# Patient Record
Sex: Male | Born: 1965 | Race: Black or African American | Hispanic: No | Marital: Single | Smoking: Former smoker
Health system: Southern US, Community
[De-identification: ages and names within clinical notes are randomized; demographics above are authoritative.]

---

## 2005-03-30 ENCOUNTER — Ambulatory Visit: Payer: Self-pay | Admitting: Family Medicine

## 2019-07-03 ENCOUNTER — Emergency Department: Payer: Self-pay

## 2019-07-03 ENCOUNTER — Encounter: Payer: Self-pay | Admitting: Emergency Medicine

## 2019-07-03 ENCOUNTER — Other Ambulatory Visit: Payer: Self-pay

## 2019-07-03 ENCOUNTER — Emergency Department
Admission: EM | Admit: 2019-07-03 | Discharge: 2019-07-04 | Disposition: A | Discharge status: Home / Self Care | Payer: Self-pay | Attending: Emergency Medicine | Admitting: Emergency Medicine

## 2019-07-03 DIAGNOSIS — M25562 Pain in left knee: Secondary | ICD-10-CM | POA: Insufficient documentation

## 2019-07-03 DIAGNOSIS — Z87891 Personal history of nicotine dependence: Secondary | ICD-10-CM | POA: Insufficient documentation

## 2019-07-03 DIAGNOSIS — M25561 Pain in right knee: Secondary | ICD-10-CM | POA: Insufficient documentation

## 2019-07-03 LAB — COMPREHENSIVE METABOLIC PANEL
ALT: 69 U/L — ABNORMAL HIGH (ref 0–44)
AST: 48 U/L — ABNORMAL HIGH (ref 15–41)
Albumin: 3.3 g/dL — ABNORMAL LOW (ref 3.5–5.0)
Alkaline Phosphatase: 120 U/L (ref 38–126)
Anion gap: 10 (ref 5–15)
BUN: 14 mg/dL (ref 6–20)
CO2: 23 mmol/L (ref 22–32)
Calcium: 9 mg/dL (ref 8.9–10.3)
Chloride: 107 mmol/L (ref 98–111)
Creatinine, Ser: 0.69 mg/dL (ref 0.61–1.24)
GFR calc Af Amer: >60 mL/min (ref >60)
GFR calc non Af Amer: >60 mL/min (ref >60)
Glucose, Bld: 123 mg/dL — ABNORMAL HIGH (ref 70–99)
Potassium: 3.9 mmol/L (ref 3.5–5.1)
Sodium: 140 mmol/L (ref 135–145)
Total Bilirubin: 0.9 mg/dL (ref 0.3–1.2)
Total Protein: 8.3 g/dL — ABNORMAL HIGH (ref 6.5–8.1)

## 2019-07-03 LAB — CBC WITH DIFFERENTIAL/PLATELET
Abs Immature Granulocytes: 0.06 10*3/uL (ref 0.00–0.07)
Basophils Absolute: 0 10*3/uL (ref 0.0–0.1)
Basophils Relative: 0 %
Eosinophils Absolute: 0.1 10*3/uL (ref 0.0–0.5)
Eosinophils Relative: 0 %
HCT: 40.6 % (ref 39.0–52.0)
Hemoglobin: 13.1 g/dL (ref 13.0–17.0)
Immature Granulocytes: 0 %
Lymphocytes Relative: 9 %
Lymphs Abs: 1.2 10*3/uL (ref 0.7–4.0)
MCH: 30.7 pg (ref 26.0–34.0)
MCHC: 32.3 g/dL (ref 30.0–36.0)
MCV: 95.1 fL (ref 80.0–100.0)
Monocytes Absolute: 1.6 10*3/uL — ABNORMAL HIGH (ref 0.1–1.0)
Monocytes Relative: 11 %
Neutro Abs: 10.8 10*3/uL — ABNORMAL HIGH (ref 1.7–7.7)
Neutrophils Relative %: 80 %
Platelets: 383 10*3/uL (ref 150–400)
RBC: 4.27 MIL/uL (ref 4.22–5.81)
RDW: 12.6 % (ref 11.5–15.5)
WBC: 13.7 10*3/uL — ABNORMAL HIGH (ref 4.0–10.5)
nRBC: 0 % (ref 0.0–0.2)

## 2019-07-03 LAB — SEDIMENTATION RATE: Sed Rate: 27 mm/hr — ABNORMAL HIGH (ref 0–20)

## 2019-07-03 NOTE — ED Triage Notes (Signed)
Pt presents from home via [facility] with c/o fall due to bilateral knee pain.Pt was unable to ambulate for ems due to knee pain. Pt c/o 8/10 bilatral knee pain. Pt unable to ambulate or stand up for ems at home. Pt has no previous medical hx. No known allergies.

## 2019-07-03 NOTE — ED Notes (Signed)
X-ray at bedside

## 2019-07-03 NOTE — ED Provider Notes (Signed)
[facility] Emergency Department Provider Note   ____________________________________________   First MD Initiated Contact with Patient 07/03/19 2200     (approximate)  I have reviewed the triage vital signs and the nursing notes.   HISTORY  Chief Complaint Knee Pain   HPI Charles Park is a 53 y.o. male reports bilateral knee pain.  This been going on for a few days and getting worse.  He fell and could not get up because of this pain.  He reports he was kneeling for quite some time before they could get him in the stretcher.  He thinks that is why his knees are little red anteriorly.        No past medical history on file.  There are no active problems to display for this patient.     Prior to Admission medications   Not on File    Allergies Patient has no known allergies.  No family history on file.  Social History Social History   Tobacco Use  . Smoking status: Former Games developermoker  . Smokeless tobacco: Never Used  Substance Use Topics  . Alcohol use: Not on file  . Drug use: Not on file    Review of Systems  Constitutional: No fever/chills Eyes: No visual changes. ENT: No sore throat. Cardiovascular: Denies chest pain. Respiratory: Denies shortness of breath. Gastrointestinal: No abdominal pain.  No nausea, no vomiting.  No diarrhea.  No constipation. Genitourinary: Negative for dysuria. Musculoskeletal: Negative for back pain. Skin: Negative for rash. Neurological: Negative for headaches, focal weakness  ____________________________________________   PHYSICAL EXAM:  VITAL SIGNS: ED Triage Vitals  Enc Vitals Group     BP 07/03/19 2057 (!) 164/92     Pulse Rate 07/03/19 2051 (!) 109     Resp 07/03/19 2051 18     Temp 07/03/19 2051 98 F (36.7 C)     Temp Source 07/03/19 2051 Oral     SpO2 07/03/19 2051 99 %     Weight 07/03/19 2052 300 lb (136.1 kg)     Height 07/03/19 2052 6\' 3"  (1.905 m)     Head Circumference  --      Peak Flow --      Pain Score 07/03/19 2052 8     Pain Loc --      Pain Edu? --      Excl. in GC? --     Constitutional: Alert and oriented. Well appearing and in no acute distress. Eyes: Conjunctivae are normal.  Head: Atraumatic. Nose: No congestion/rhinnorhea. Mouth/Throat: Mucous membranes are moist.  Oropharynx non-erythematous. Neck: No stridor.  Cardiovascular: Normal rate, regular rhythm. Grossly normal heart sounds.  Good peripheral circulation. Respiratory: Normal respiratory effort.  No retractions. Lungs CTAB. Gastrointestinal: Soft and nontender. No distention. No abdominal bruits. No CVA tenderness. Musculoskeletal: Both this gentleman's knees are swollen.  There are effusions present.  He is difficult for me to bend his knees because of the pain.  There is some redness anteriorly but not on either side of the knee or posteriorly.  This may in fact be due to have been kneeling for quite some time. Neurologic:  Normal speech and language. No gross focal neurologic deficits are appreciated.  Skin:  Skin is warm, dry and intact. No rash noted.   ____________________________________________   LABS (all labs ordered are listed, but only abnormal results are displayed)  Labs Reviewed  COMPREHENSIVE METABOLIC PANEL - Abnormal; Notable for the following components:  Result Value   Glucose, Bld 123 (*)    Total Protein 8.3 (*)    Albumin 3.3 (*)    AST 48 (*)    ALT 69 (*)    All other components within normal limits  CBC WITH DIFFERENTIAL/PLATELET - Abnormal; Notable for the following components:   WBC 13.7 (*)    Neutro Abs 10.8 (*)    Monocytes Absolute 1.6 (*)    All other components within normal limits  SEDIMENTATION RATE - Abnormal; Notable for the following components:   Sed Rate 27 (*)    All other components within normal limits   ____________________________________________  EKG   ____________________________________________  RADIOLOGY   ED MD interpretation: Patient with bilateral knee osteoarthritis and joint effusions per radiology reading.  I reviewed the films.  Official radiology report(s): Dg Knee Complete 4 Views Left  Result Date: 07/03/2019 CLINICAL DATA:  Fall with bilateral knee pain. Unable to ambulate. EXAM: LEFT KNEE - COMPLETE 4+ VIEW COMPARISON:  None. FINDINGS: No acute fracture or dislocation. Moderate to large joint effusion. Tricompartmental osteoarthritis with peripheral spurring. Subchondral cystic change in the patellofemoral compartment. Generalized soft tissue edema versus habitus. IMPRESSION: 1. 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 2. Moderate to large joint effusion. Electronically Signed   By: Keith Rake M.D.   On: 07/03/2019 23:10   Dg Knee Complete 4 Views Right  Result Date: 07/03/2019 CLINICAL DATA:  Fall with bilateral knee pain. Unable to ambulate. EXAM: RIGHT KNEE - COMPLETE 4+ VIEW COMPARISON:  Radiograph 03/30/2005 FINDINGS: No fracture or dislocation. Tricompartmental peripheral spurring. Moderate to large joint effusion. Enthesopathic changes noted about the patella. Generalized soft tissue edema versus habitus. IMPRESSION: 1. 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 2. Moderate to large joint effusion. Electronically Signed   By: Keith Rake M.D.   On: 07/03/2019 23:11    ____________________________________________   PROCEDURES  Procedure(s) performed (including Critical Care):  Procedures   ____________________________________________   INITIAL IMPRESSION / ASSESSMENT AND PLAN / ED COURSE  Charles Park was evaluated in Emergency Department on 07/04/2019 for the symptoms described in the history of present illness. He was evaluated in the context of the global COVID-19 pandemic, which necessitated consideration that the patient might be at risk for infection with the SARS-CoV-2 virus that causes COVID-19.  Institutional protocols and algorithms that pertain to the evaluation of patients at risk for COVID-19 are in a state of rapid change based on information released by regulatory bodies including the CDC and federal and state organizations. These policies and algorithms were followed during the patient's care in the ED.    ----------------------------------------- 12:41 AM on 07/04/2019 -----------------------------------------  On reexamination patient's knees are no longer red or warm.  Apparently this was because he was kneeling on them for prolonged period.  Patient is doing fairly well.  I will give him a walker.  He can follow-up with orthopedics.  He does have a bad osteoarthritis in both knees along with joint effusions.        ____________________________________________   FINAL CLINICAL IMPRESSION(S) / ED DIAGNOSES  Final diagnoses:  Pain in both knees, unspecified chronicity     ED Discharge Orders    None       Note:  This document was prepared using Dragon voice recognition software and may include unintentional dictation errors.    Nena Polio, MD 07/04/19 [phone]

## 2019-07-04 NOTE — Discharge Instructions (Addendum)
You have fluid on both knees and bad osteoarthritis in both knees.  This is probably causing the pain.  Please follow-up with Dr. Mack Guise the orthopedic surgeon.  Give his office a call in the morning let them know that you were seen in the emergency room.  They should be able to see you fairly quickly.  Please return here for worse pain.  Use your walker To get around.  You can use Tylenol for pain or Motrin 2 of the over-the-counter pills 3 times a day with food.  I would not use the Motrin for more than a week at a time.  When you get a little older it can give you ulcers or mess up your kidneys.  Please return here if you get worse.  This includes increasing swelling or redness in the legs.

## 2020-02-08 IMAGING — DX LEFT KNEE - COMPLETE 4+ VIEW
4 series · 4 of 4 positions shown · non-contrast
Comparison: None.

CLINICAL DATA: Fall with bilateral knee pain. Unable to ambulate.

EXAM:
LEFT KNEE - COMPLETE 4+ VIEW

[knee ap]
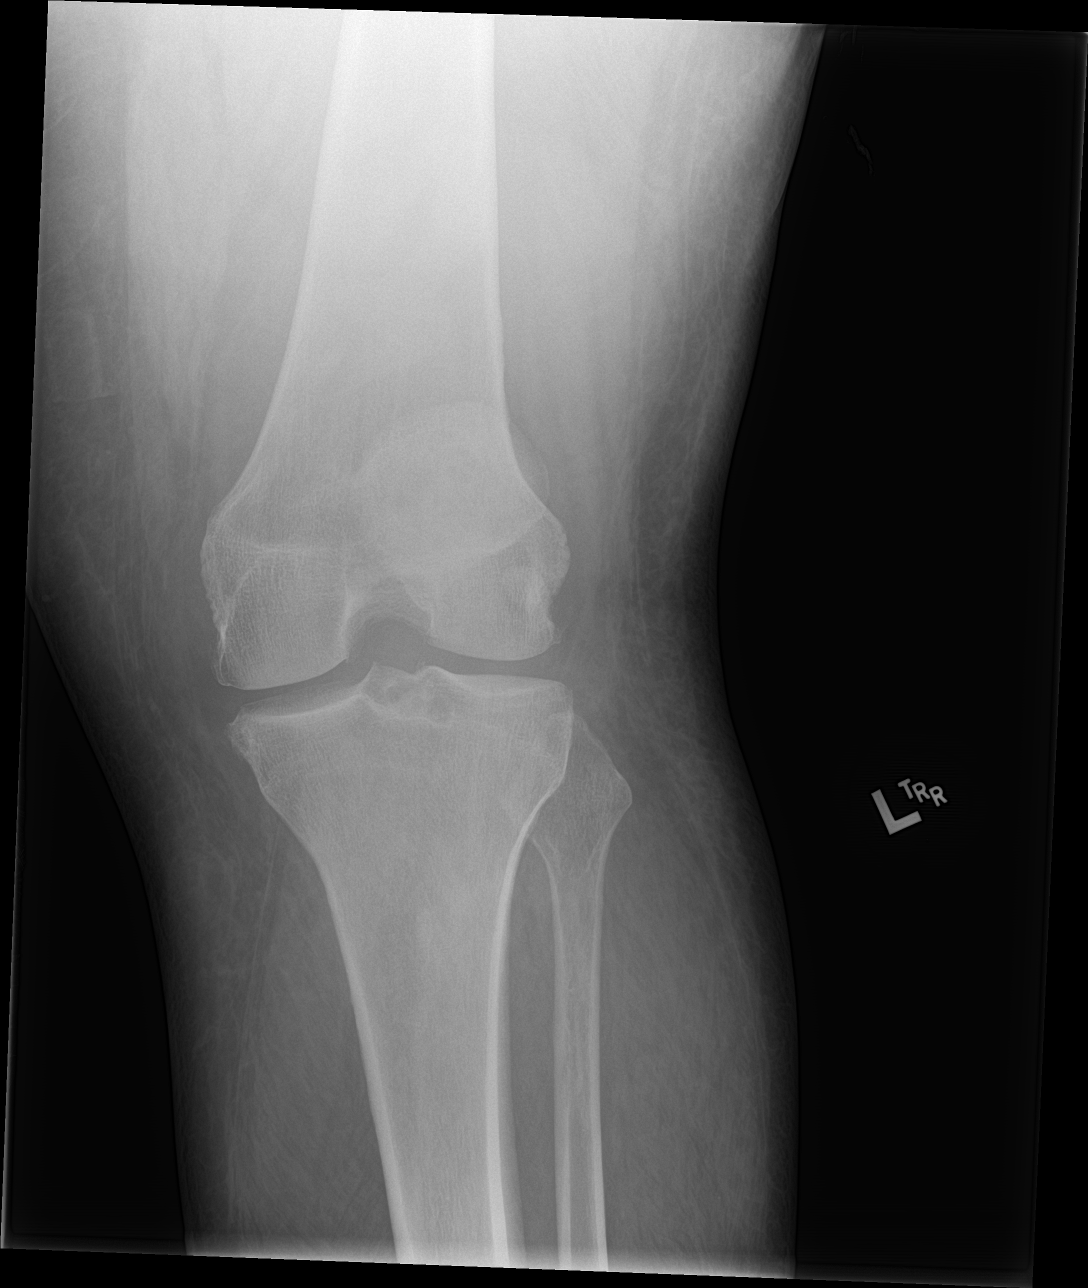

[knee obl (1 of 2)]
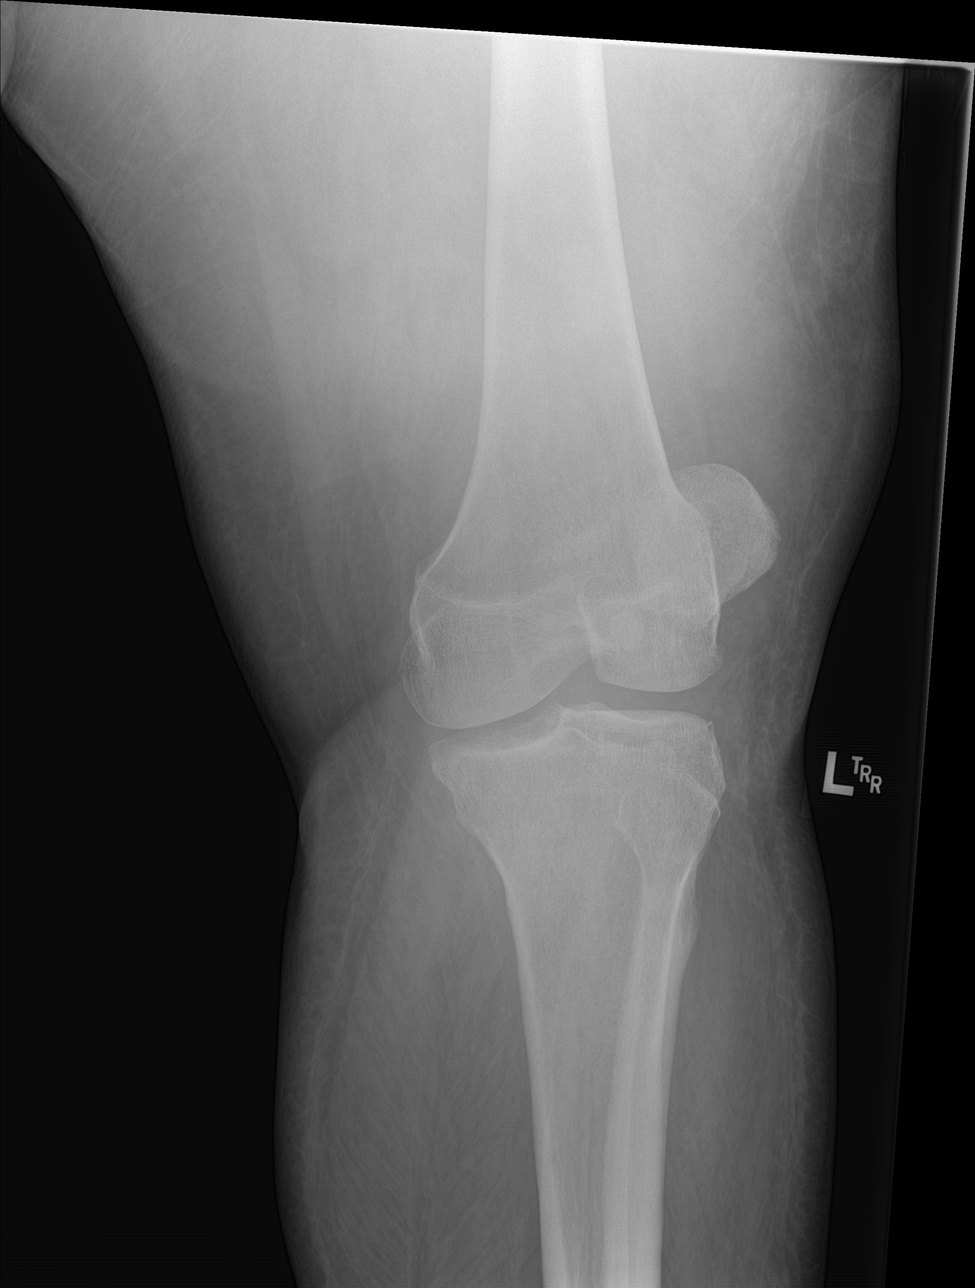

[knee obl (2 of 2)]
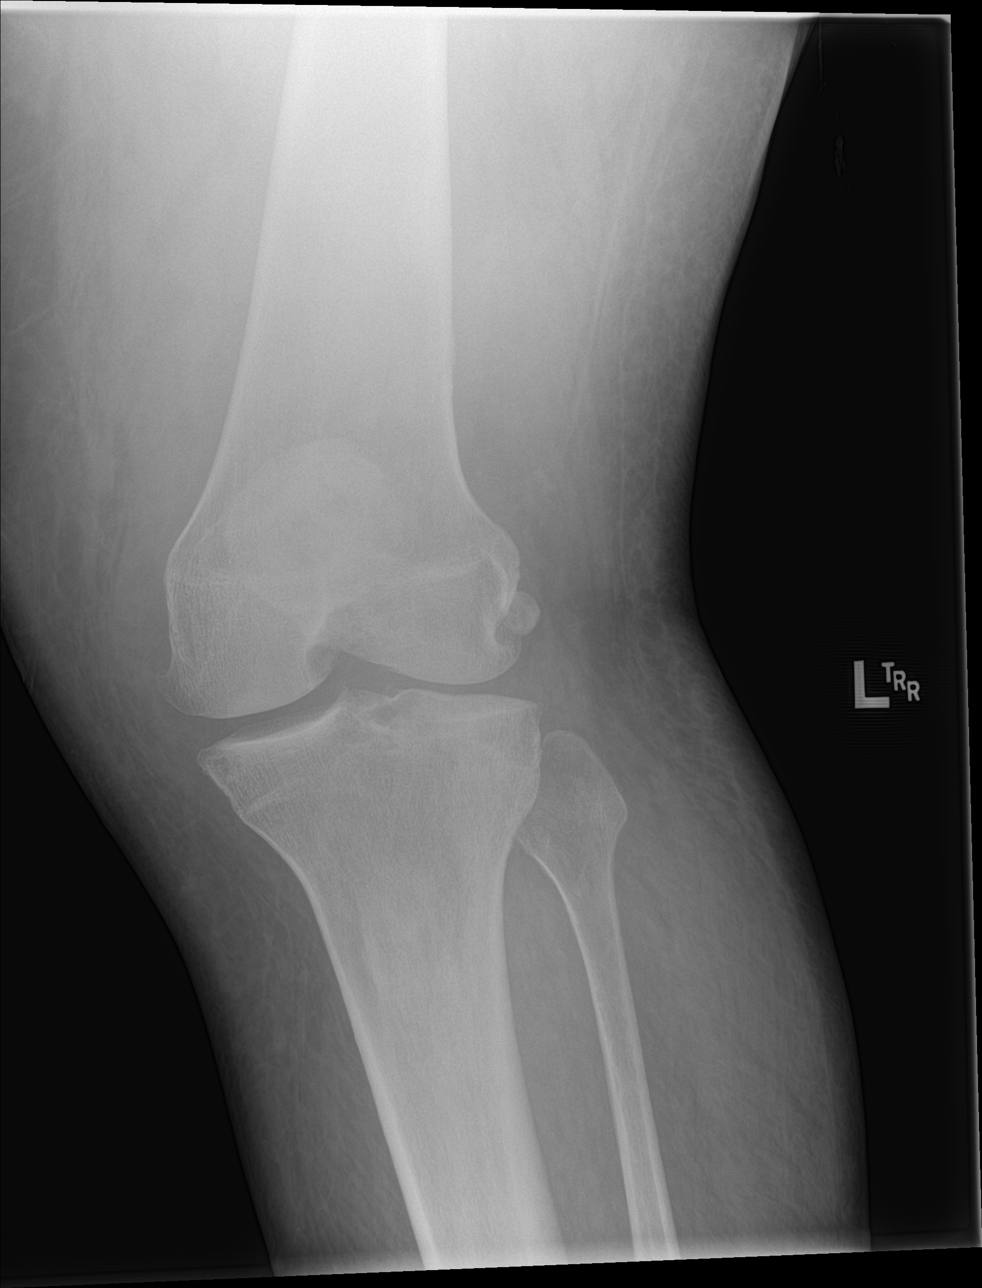

[knee lat]
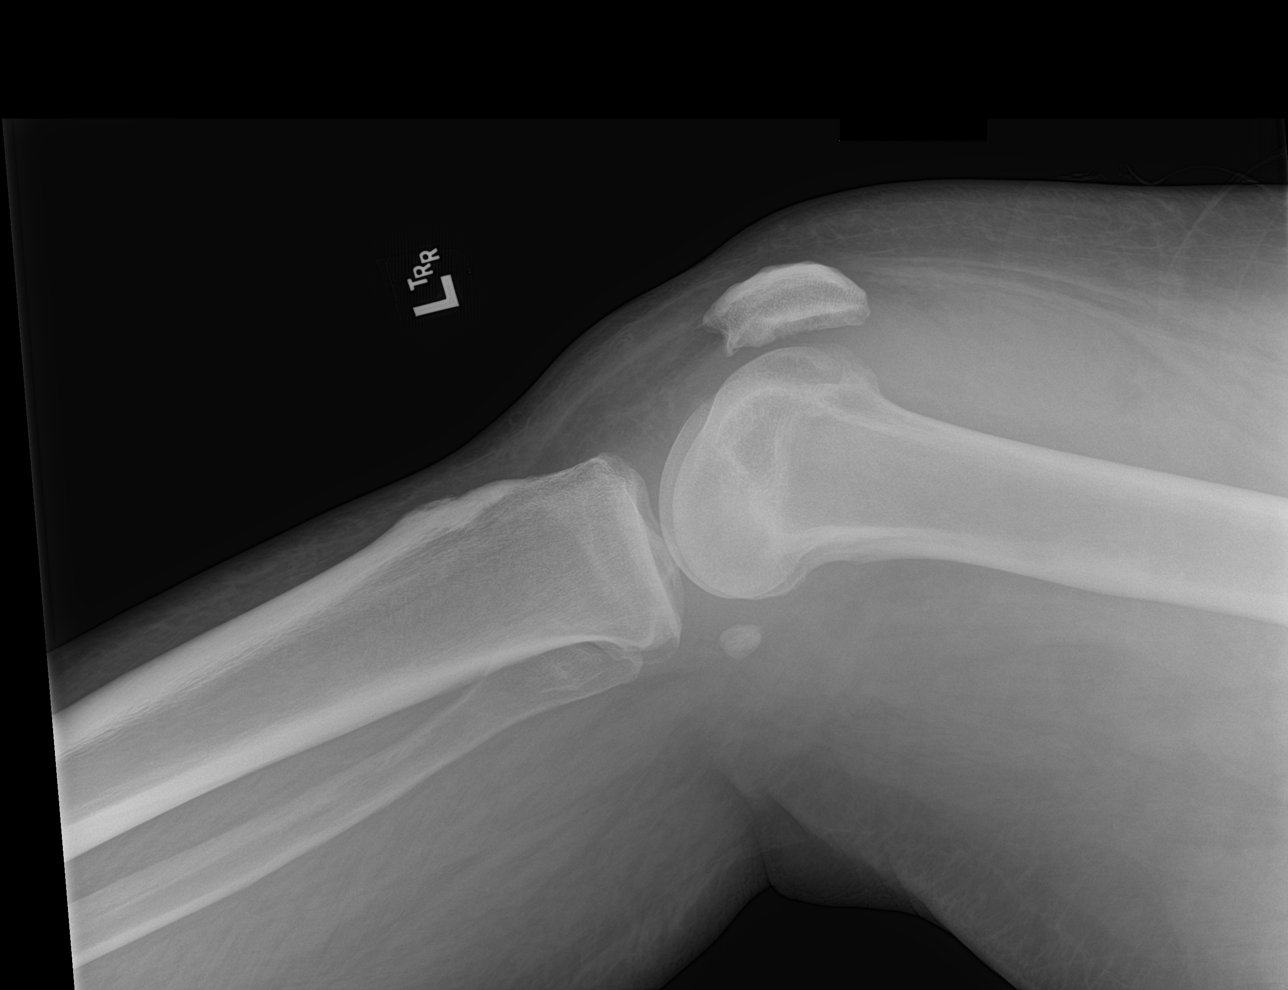

[4 of 4 positions shown; findings below may reference images not displayed]

FINDINGS: No acute fracture or dislocation. Moderate to large joint effusion.
Tricompartmental osteoarthritis with peripheral spurring.
Subchondral cystic change in the patellofemoral compartment.
Generalized soft tissue edema versus habitus.
IMPRESSION: 1. Tricompartmental osteoarthritis without acute fracture or
dislocation.
2. Moderate to large joint effusion.

## 2024-05-22 ENCOUNTER — Emergency Department: Payer: MEDICAID

## 2024-05-22 ENCOUNTER — Other Ambulatory Visit: Payer: Self-pay

## 2024-05-22 ENCOUNTER — Inpatient Hospital Stay
Admission: EM | Admit: 2024-05-22 | Discharge: 2024-06-03 | DRG: 554 | Disposition: A | Discharge status: Skilled Nursing Facility | Payer: MEDICAID | Attending: Internal Medicine | Admitting: Internal Medicine

## 2024-05-22 ENCOUNTER — Emergency Department: Payer: Self-pay

## 2024-05-22 ENCOUNTER — Encounter: Payer: Self-pay | Admitting: Internal Medicine

## 2024-05-22 DIAGNOSIS — M4312 Spondylolisthesis, cervical region: Secondary | ICD-10-CM | POA: Diagnosis present

## 2024-05-22 DIAGNOSIS — Z87891 Personal history of nicotine dependence: Secondary | ICD-10-CM | POA: Diagnosis not present

## 2024-05-22 DIAGNOSIS — K5900 Constipation, unspecified: Secondary | ICD-10-CM | POA: Diagnosis present

## 2024-05-22 DIAGNOSIS — M1990 Unspecified osteoarthritis, unspecified site: Secondary | ICD-10-CM | POA: Diagnosis not present

## 2024-05-22 DIAGNOSIS — E785 Hyperlipidemia, unspecified: Secondary | ICD-10-CM | POA: Diagnosis present

## 2024-05-22 DIAGNOSIS — E871 Hypo-osmolality and hyponatremia: Secondary | ICD-10-CM | POA: Diagnosis present

## 2024-05-22 DIAGNOSIS — I2510 Atherosclerotic heart disease of native coronary artery without angina pectoris: Secondary | ICD-10-CM | POA: Diagnosis present

## 2024-05-22 DIAGNOSIS — R262 Difficulty in walking, not elsewhere classified: Secondary | ICD-10-CM | POA: Diagnosis present

## 2024-05-22 DIAGNOSIS — Z5971 Insufficient health insurance coverage: Secondary | ICD-10-CM | POA: Diagnosis not present

## 2024-05-22 DIAGNOSIS — T502X5A Adverse effect of carbonic-anhydrase inhibitors, benzothiadiazides and other diuretics, initial encounter: Secondary | ICD-10-CM | POA: Diagnosis present

## 2024-05-22 DIAGNOSIS — R29898 Other symptoms and signs involving the musculoskeletal system: Principal | ICD-10-CM

## 2024-05-22 DIAGNOSIS — L03115 Cellulitis of right lower limb: Secondary | ICD-10-CM | POA: Diagnosis present

## 2024-05-22 DIAGNOSIS — E559 Vitamin D deficiency, unspecified: Secondary | ICD-10-CM | POA: Diagnosis present

## 2024-05-22 DIAGNOSIS — M064 Inflammatory polyarthropathy: Secondary | ICD-10-CM | POA: Diagnosis present

## 2024-05-22 DIAGNOSIS — R531 Weakness: Secondary | ICD-10-CM | POA: Diagnosis not present

## 2024-05-22 DIAGNOSIS — I10 Essential (primary) hypertension: Secondary | ICD-10-CM | POA: Diagnosis present

## 2024-05-22 DIAGNOSIS — M25531 Pain in right wrist: Secondary | ICD-10-CM | POA: Diagnosis present

## 2024-05-22 DIAGNOSIS — T380X5A Adverse effect of glucocorticoids and synthetic analogues, initial encounter: Secondary | ICD-10-CM | POA: Diagnosis present

## 2024-05-22 DIAGNOSIS — M62838 Other muscle spasm: Secondary | ICD-10-CM | POA: Diagnosis not present

## 2024-05-22 DIAGNOSIS — L03116 Cellulitis of left lower limb: Secondary | ICD-10-CM | POA: Diagnosis present

## 2024-05-22 DIAGNOSIS — G992 Myelopathy in diseases classified elsewhere: Secondary | ICD-10-CM | POA: Diagnosis present

## 2024-05-22 DIAGNOSIS — M4802 Spinal stenosis, cervical region: Secondary | ICD-10-CM | POA: Diagnosis present

## 2024-05-22 DIAGNOSIS — R739 Hyperglycemia, unspecified: Secondary | ICD-10-CM | POA: Diagnosis not present

## 2024-05-22 DIAGNOSIS — M25532 Pain in left wrist: Secondary | ICD-10-CM | POA: Diagnosis present

## 2024-05-22 DIAGNOSIS — F1090 Alcohol use, unspecified, uncomplicated: Secondary | ICD-10-CM | POA: Diagnosis not present

## 2024-05-22 DIAGNOSIS — Z6841 Body Mass Index (BMI) 40.0 and over, adult: Secondary | ICD-10-CM | POA: Diagnosis not present

## 2024-05-22 DIAGNOSIS — E66813 Obesity, class 3: Secondary | ICD-10-CM | POA: Diagnosis present

## 2024-05-22 DIAGNOSIS — R21 Rash and other nonspecific skin eruption: Secondary | ICD-10-CM | POA: Diagnosis present

## 2024-05-22 LAB — URINALYSIS, ROUTINE W REFLEX MICROSCOPIC
Bilirubin Urine: NEGATIVE
Glucose, UA: NEGATIVE mg/dL
Ketones, ur: NEGATIVE mg/dL
Leukocytes,Ua: NEGATIVE
Nitrite: NEGATIVE
Protein, ur: 30 mg/dL — AB
Specific Gravity, Urine: 1.025 (ref 1.005–1.030)
pH: 5 (ref 5.0–8.0)

## 2024-05-22 LAB — COMPREHENSIVE METABOLIC PANEL WITH GFR
ALT: 57 U/L — ABNORMAL HIGH (ref 0–44)
AST: 40 U/L (ref 15–41)
Albumin: 3.1 g/dL — ABNORMAL LOW (ref 3.5–5.0)
Alkaline Phosphatase: 90 U/L (ref 38–126)
Anion gap: 12 (ref 5–15)
BUN: 33 mg/dL — ABNORMAL HIGH (ref 6–20)
CO2: 21 mmol/L — ABNORMAL LOW (ref 22–32)
Calcium: 9.1 mg/dL (ref 8.9–10.3)
Chloride: 103 mmol/L (ref 98–111)
Creatinine, Ser: 0.89 mg/dL (ref 0.61–1.24)
GFR, Estimated: >60 mL/min (ref >60)
Glucose, Bld: 96 mg/dL (ref 70–99)
Potassium: 3.7 mmol/L (ref 3.5–5.1)
Sodium: 136 mmol/L (ref 135–145)
Total Bilirubin: 1.5 mg/dL — ABNORMAL HIGH (ref 0.0–1.2)
Total Protein: 8.3 g/dL — ABNORMAL HIGH (ref 6.5–8.1)

## 2024-05-22 LAB — CBC WITH DIFFERENTIAL/PLATELET
Abs Immature Granulocytes: 0.04 10*3/uL (ref 0.00–0.07)
Basophils Absolute: 0 10*3/uL (ref 0.0–0.1)
Basophils Relative: 0 %
Eosinophils Absolute: 0 10*3/uL (ref 0.0–0.5)
Eosinophils Relative: 0 %
HCT: 43.1 % (ref 39.0–52.0)
Hemoglobin: 14.2 g/dL (ref 13.0–17.0)
Immature Granulocytes: 0 %
Lymphocytes Relative: 9 %
Lymphs Abs: 0.9 10*3/uL (ref 0.7–4.0)
MCH: 31.1 pg (ref 26.0–34.0)
MCHC: 32.9 g/dL (ref 30.0–36.0)
MCV: 94.5 fL (ref 80.0–100.0)
Monocytes Absolute: 1.2 10*3/uL — ABNORMAL HIGH (ref 0.1–1.0)
Monocytes Relative: 11 %
Neutro Abs: 8.1 10*3/uL — ABNORMAL HIGH (ref 1.7–7.7)
Neutrophils Relative %: 80 %
Platelets: 265 10*3/uL (ref 150–400)
RBC: 4.56 MIL/uL (ref 4.22–5.81)
RDW: 12.9 % (ref 11.5–15.5)
WBC: 10.3 10*3/uL (ref 4.0–10.5)
nRBC: 0 % (ref 0.0–0.2)

## 2024-05-22 LAB — MAGNESIUM: Magnesium: 2.5 mg/dL — ABNORMAL HIGH (ref 1.7–2.4)

## 2024-05-22 LAB — GROUP A STREP BY PCR: Group A Strep by PCR: NOT DETECTED

## 2024-05-22 LAB — CK: Total CK: 87 U/L (ref 49–397)

## 2024-05-22 MED ORDER — OXYCODONE HCL 5 MG PO TABS
5.0000 mg | ORAL_TABLET | ORAL | Status: DC | PRN
  Administered 2024-05-22 – 2024-05-24 (×6): 5 mg via ORAL
  Filled 2024-05-22 (×6): qty 1

## 2024-05-22 MED ORDER — ACETAMINOPHEN 325 MG PO TABS
650.0000 mg | ORAL_TABLET | Freq: Four times a day (QID) | ORAL | Status: DC | PRN
  Administered 2024-05-23: 650 mg via ORAL
  Filled 2024-05-22: qty 2

## 2024-05-22 MED ORDER — LISINOPRIL 20 MG PO TABS
20.0000 mg | ORAL_TABLET | Freq: Every day | ORAL | Status: DC
  Administered 2024-05-22 – 2024-06-03 (×13): 20 mg via ORAL
  Filled 2024-05-22 (×13): qty 1

## 2024-05-22 MED ORDER — GADOBUTROL 1 MMOL/ML IV SOLN
10.0000 mL | Freq: Once | INTRAVENOUS | Status: AC | PRN
  Administered 2024-05-22: 10 mL via INTRAVENOUS

## 2024-05-22 MED ORDER — CEFAZOLIN SODIUM-DEXTROSE 2-4 GM/100ML-% IV SOLN
2.0000 g | Freq: Three times a day (TID) | INTRAVENOUS | Status: DC
  Administered 2024-05-23 – 2024-05-28 (×17): 2 g via INTRAVENOUS
  Filled 2024-05-22 (×17): qty 100

## 2024-05-22 MED ORDER — SODIUM CHLORIDE 0.9 % IV BOLUS
1000.0000 mL | Freq: Once | INTRAVENOUS | Status: AC
  Administered 2024-05-22: 1000 mL via INTRAVENOUS

## 2024-05-22 MED ORDER — ONDANSETRON HCL 4 MG PO TABS: 4.0000 mg | ORAL_TABLET | Freq: Four times a day (QID) | ORAL | Status: DC | PRN

## 2024-05-22 MED ORDER — ENOXAPARIN SODIUM 80 MG/0.8ML IJ SOSY
0.5000 mg/kg | PREFILLED_SYRINGE | INTRAMUSCULAR | Status: DC
  Administered 2024-05-22 – 2024-06-02 (×12): 80 mg via SUBCUTANEOUS
  Filled 2024-05-22 (×13): qty 0.8

## 2024-05-22 MED ORDER — IBUPROFEN 400 MG PO TABS
600.0000 mg | ORAL_TABLET | Freq: Four times a day (QID) | ORAL | Status: DC | PRN
  Administered 2024-05-23 (×2): 600 mg via ORAL
  Filled 2024-05-22 (×2): qty 2

## 2024-05-22 MED ORDER — ONDANSETRON HCL 4 MG/2ML IJ SOLN: 4.0000 mg | Freq: Four times a day (QID) | INTRAMUSCULAR | Status: DC | PRN

## 2024-05-22 MED ORDER — HYDROCHLOROTHIAZIDE 12.5 MG PO TABS
12.5000 mg | ORAL_TABLET | Freq: Every day | ORAL | Status: DC
  Administered 2024-05-22 – 2024-06-03 (×13): 12.5 mg via ORAL
  Filled 2024-05-22 (×13): qty 1

## 2024-05-22 MED ORDER — SODIUM CHLORIDE 0.9 % IV SOLN
2.0000 g | Freq: Once | INTRAVENOUS | Status: AC
  Administered 2024-05-22: 2 g via INTRAVENOUS
  Filled 2024-05-22: qty 20

## 2024-05-22 NOTE — Plan of Care (Signed)
   Problem: Education: Goal: Knowledge of General Education information will improve Description: Including pain rating scale, medication(s)/side effects and non-pharmacologic comfort measures Outcome: Progressing   Problem: Health Behavior/Discharge Planning: Goal: Ability to manage health-related needs will improve Outcome: Progressing   Problem: Clinical Measurements: Goal: Ability to maintain clinical measurements within normal limits will improve Outcome: Progressing Goal: Will remain free from infection Outcome: Progressing Goal: Diagnostic test results will improve Outcome: Progressing Goal: Respiratory complications will improve Outcome: Progressing Goal: Cardiovascular complication will be avoided Outcome: Progressing   Problem: Activity: Goal: Risk for activity intolerance will decrease Outcome: Progressing   Problem: Nutrition: Goal: Adequate nutrition will be maintained Outcome: Progressing   Problem: Coping: Goal: Level of anxiety will decrease Outcome: Progressing   Problem: Elimination: Goal: Will not experience complications related to bowel motility Outcome: Progressing Goal: Will not experience complications related to urinary retention Outcome: Progressing   Problem: Pain Managment: Goal: General experience of comfort will improve and/or be controlled Outcome: Progressing   Problem: Safety: Goal: Ability to remain free from injury will improve Outcome: Progressing   Problem: Skin Integrity: Goal: Risk for impaired skin integrity will decrease Outcome: Progressing   Problem: Clinical Measurements: Goal: Ability to avoid or minimize complications of infection will improve Outcome: Progressing   Problem: Skin Integrity: Goal: Skin integrity will improve Outcome: Progressing   Problem: Clinical Measurements: Goal: Ability to avoid or minimize complications of infection will improve Outcome: Progressing   Problem: Skin Integrity: Goal: Skin  integrity will improve Outcome: Progressing

## 2024-05-22 NOTE — ED Notes (Signed)
Pt transported off floor for MRI

## 2024-05-22 NOTE — ED Provider Notes (Signed)
 [facility] Provider Note    Event Date/Time   First MD Initiated Contact with Patient 05/22/24 [phone]     (approximate)   History   Weakness   HPI  Charles Park is a 58 year old male with no known past medical history but no routine medical care presenting to the emergency department for evaluation of the leg weakness.  3 days ago patient was mowing his lawn when he thought he rolled his ankle.  He was able to walk inside and sit in his recliner, but reports that he has not been able to stand up for the past 3 days.  He denies falling or hitting his head.  He denies back pain.  Denies history of similar.  Reports weakness of his bilateral lower legs.  No numbness, tingling, fevers.     Physical Exam   Triage Vital Signs: ED Triage Vitals  Encounter Vitals Group     BP 05/22/24 0717 (!) 147/94     Systolic BP Percentile --      Diastolic BP Percentile --      Pulse Rate 05/22/24 0717 91     Resp 05/22/24 0717 20     Temp 05/22/24 0726 99 F (37.2 C)     Temp Source 05/22/24 0726 Oral     SpO2 05/22/24 0717 98 %     Weight 05/22/24 0727 (!) 350 lb (158.8 kg)     Height 05/22/24 0727 6\' 3"  (1.905 m)     Head Circumference --      Peak Flow --      Pain Score 05/22/24 0726 8     Pain Loc --      Pain Education --      Exclude from Growth Chart --     Most recent vital signs: Vitals:   05/22/24 1435 05/22/24 1530  BP: (!) 166/105 (!) 162/89  Pulse: 92 95  Resp: (!) 25 (!) 22  Temp:    SpO2: 90% 97%     General: Awake, interactive  CV:  Regular rate, good peripheral perfusion.  Resp:  Unlabored respirations, lungs clear to auscultation Abd:  Nondistended, soft, nontender Neuro:  Symmetric facial movement, fluid speech, 5 out of 5 strength in the bilateral upper extremities.  5 out of 5 strength with dorsiflexion and plantarflexion of the bilateral lower extremities.  Significant weakness with hip flexion of the bilateral lower  extremities, unable to lift right lower extremity antigravity and is only able to lift left lower extremity antigravity with significant assistance from his upper arms.  Somewhat limited exam, but appears to have intact strength with knee extension.  Intact sensation throughout the lower extremities.  No midline lower back pain.   ED Results / Procedures / Treatments   Labs (all labs ordered are listed, but only abnormal results are displayed) Labs Reviewed  URINALYSIS, ROUTINE W REFLEX MICROSCOPIC - Abnormal; Notable for the following components:      Result Value   Color, Urine AMBER (*)    APPearance HAZY (*)    Hgb urine dipstick SMALL (*)    Protein, ur 30 (*)    Bacteria, UA RARE (*)    All other components within normal limits  CBC WITH DIFFERENTIAL/PLATELET - Abnormal; Notable for the following components:   Neutro Abs 8.1 (*)    Monocytes Absolute 1.2 (*)    All other components within normal limits  MAGNESIUM - Abnormal; Notable for the following components:   Magnesium 2.5 (*)  All other components within normal limits  COMPREHENSIVE METABOLIC PANEL WITH GFR - Abnormal; Notable for the following components:   CO2 21 (*)    BUN 33 (*)    Total Protein 8.3 (*)    Albumin 3.1 (*)    ALT 57 (*)    Total Bilirubin 1.5 (*)    All other components within normal limits  CK  CBC WITH DIFFERENTIAL/PLATELET     EKG EKG independently reviewed interpreted by myself (ER attending) demonstrates:     RADIOLOGY Imaging independently reviewed and interpreted by myself demonstrates:  XR lower legs without acute fracture, demonstrates degenerative changes Ultrasound of bilateral lower extremities without DVT MRI of the L-spine without acute spinal cord compression  Formal Radiology Read:  MR Lumbar Spine W Wo Contrast Result Date: 05/22/2024 CLINICAL DATA:  Myelopathy, acute, lumbar spine. Bilateral proximal lower extremity weakness. EXAM: MRI LUMBAR SPINE WITHOUT AND WITH  CONTRAST TECHNIQUE: Multiplanar and multiecho pulse sequences of the lumbar spine were obtained without and with intravenous contrast. CONTRAST:  10mL GADAVIST GADOBUTROL 1 MMOL/ML IV SOLN COMPARISON:  None Available. FINDINGS: Segmentation:  Standard. Alignment:  Normal. Vertebrae: No fracture, suspicious marrow lesion, or significant marrow edema. Moderate Modic type 2 endplate changes at L5-S1. Conus medullaris and cauda equina: Conus extends to the L1-2 level. Conus and cauda equina appear normal. Paraspinal and other soft tissues: Unremarkable. Disc levels: T12-L1: Mild facet hypertrophy without disc herniation or stenosis. L1-2: Moderate facet hypertrophy without disc herniation or stenosis. L2-3: Minimal disc bulging and moderate facet and ligamentum flavum hypertrophy without stenosis. L3-4: Disc desiccation and mild disc space narrowing. Disc bulging and severe facet and ligamentum flavum hypertrophy result in mild spinal stenosis and moderate right and moderate to severe left neural foraminal stenosis. L4-5: Disc desiccation and mild disc space narrowing. Disc bulging, a central disc protrusion with annular fissure, and mild-to-moderate facet and ligamentum flavum hypertrophy result in mild spinal stenosis and moderate bilateral neural foraminal stenosis. L5-S1: Disc desiccation and moderate disc space narrowing. Circumferential disc bulging and mild-to-moderate facet hypertrophy result in moderate bilateral neural foraminal stenosis without spinal stenosis. IMPRESSION: 1. 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 2. Moderate to severe neural foraminal stenosis at L3-4 and moderate foraminal stenosis at L4-5 and L5-S1. Electronically Signed   By: Aundra Lee M.D.   On: 05/22/2024 15:14   US  Venous Img Lower Bilateral Result Date: 05/22/2024 CLINICAL DATA:  Lower extremity swelling for 4 days EXAM: Bilateral Lower Extremity Venous Doppler Ultrasound  TECHNIQUE: Gray-scale sonography with compression, as well as color and duplex ultrasound, were performed to evaluate the deep venous system(s) from the level of the common femoral vein through the popliteal and proximal calf veins. COMPARISON:  None available FINDINGS: VENOUS Normal compressibility of the common femoral, superficial femoral, and popliteal veins, as well as the visualized calf veins. Visualized portions of profunda femoral vein and great saphenous vein unremarkable. No filling defects to suggest DVT on grayscale or color Doppler imaging. Doppler waveforms show normal direction of venous flow, normal respiratory plasticity and response to augmentation. OTHER None. Limitations: Limited visualization of the calf veins. IMPRESSION: No lower extremity DVT. Electronically Signed   By: Elester Grim M.D.   On: 05/22/2024 12:24   DG Ankle Complete Right Result Date: 05/22/2024 CLINICAL DATA:  Tripped and fell, bilateral lower extremity swelling and pain EXAM: RIGHT ANKLE - COMPLETE 3+ VIEW; LEFT ANKLE COMPLETE - 3+ VIEW COMPARISON:  None Available.  FINDINGS: Left ankle: Frontal, oblique, and lateral views are obtained. There are no acute displaced fractures. Moderate osteoarthritis throughout the ankle, hindfoot, and visualized midfoot. Pes planus deformity. Diffuse subcutaneous edema. Prominent inferior calcaneal spur. Right ankle: Frontal, oblique, and lateral views are obtained. No acute displaced fractures. Moderate to severe osteoarthritis throughout the ankle, hindfoot, and visualized midfoot. Prominent inferior calcaneal spur. Pes planus deformity. Diffuse soft tissue edema. IMPRESSION: 1. No acute displaced fractures within either ankle. 2. Symmetrical osteoarthritis of the bilateral ankles, hind feet, and mid feet. 3. Pes planus deformity bilaterally. 4. Extensive bilateral soft tissue edema. Electronically Signed   By: Bobbye Burrow M.D.   On: 05/22/2024 08:28   DG Ankle Complete Left Result  Date: 05/22/2024 CLINICAL DATA:  Tripped and fell, bilateral lower extremity swelling and pain EXAM: RIGHT ANKLE - COMPLETE 3+ VIEW; LEFT ANKLE COMPLETE - 3+ VIEW COMPARISON:  None Available. FINDINGS: Left ankle: Frontal, oblique, and lateral views are obtained. There are no acute displaced fractures. Moderate osteoarthritis throughout the ankle, hindfoot, and visualized midfoot. Pes planus deformity. Diffuse subcutaneous edema. Prominent inferior calcaneal spur. Right ankle: Frontal, oblique, and lateral views are obtained. No acute displaced fractures. Moderate to severe osteoarthritis throughout the ankle, hindfoot, and visualized midfoot. Prominent inferior calcaneal spur. Pes planus deformity. Diffuse soft tissue edema. IMPRESSION: 1. No acute displaced fractures within either ankle. 2. Symmetrical osteoarthritis of the bilateral ankles, hind feet, and mid feet. 3. Pes planus deformity bilaterally. 4. Extensive bilateral soft tissue edema. Electronically Signed   By: Bobbye Burrow M.D.   On: 05/22/2024 08:28   DG Knee Complete 4 Views Left Result Date: 05/22/2024 CLINICAL DATA:  Tripped and fell last week, bilateral leg swelling and pain EXAM: LEFT KNEE - COMPLETE 4+ VIEW; RIGHT KNEE - COMPLETE 4+ VIEW COMPARISON:  07/03/2019 FINDINGS: Left knee: Frontal, bilateral oblique, and cross-table lateral views are obtained. No acute displaced fracture, subluxation, or dislocation. There is mild 3 compartmental osteoarthritis greatest in the patellofemoral compartment. Moderate joint effusion. Diffuse subcutaneous edema. Right knee: Frontal, bilateral oblique, and cross-table lateral views are obtained. No acute fracture, subluxation, or dislocation. Mild 3 compartmental osteoarthritis greatest in the medial and patellofemoral compartments. Moderate joint effusion. Diffuse subcutaneous edema. IMPRESSION: 1. Bilateral 3 compartmental osteoarthritis and moderate knee effusions. 2. No acute displaced fractures. 3.  Diffuse subcutaneous edema bilaterally. Electronically Signed   By: Bobbye Burrow M.D.   On: 05/22/2024 08:26   DG Knee Complete 4 Views Right Result Date: 05/22/2024 CLINICAL DATA:  Tripped and fell last week, bilateral leg swelling and pain EXAM: LEFT KNEE - COMPLETE 4+ VIEW; RIGHT KNEE - COMPLETE 4+ VIEW COMPARISON:  07/03/2019 FINDINGS: Left knee: Frontal, bilateral oblique, and cross-table lateral views are obtained. No acute displaced fracture, subluxation, or dislocation. There is mild 3 compartmental osteoarthritis greatest in the patellofemoral compartment. Moderate joint effusion. Diffuse subcutaneous edema. Right knee: Frontal, bilateral oblique, and cross-table lateral views are obtained. No acute fracture, subluxation, or dislocation. Mild 3 compartmental osteoarthritis greatest in the medial and patellofemoral compartments. Moderate joint effusion. Diffuse subcutaneous edema. IMPRESSION: 1. Bilateral 3 compartmental osteoarthritis and moderate knee effusions. 2. No acute displaced fractures. 3. Diffuse subcutaneous edema bilaterally. Electronically Signed   By: Bobbye Burrow M.D.   On: 05/22/2024 08:26   DG Hip Unilat W or Wo Pelvis 2-3 Views Right Result Date: 05/22/2024 CLINICAL DATA:  Marvell Slider, pain EXAM: DG HIP (WITH OR WITHOUT PELVIS) 2-3V RIGHT COMPARISON:  None Available. FINDINGS: Frontal view of the pelvis as  well as frontal and frogleg lateral views of the right hip are obtained. Assessment is slightly limited by patient body habitus. No evidence of acute fracture, subluxation, or dislocation. Mild symmetrical bilateral hip osteoarthritis. Sacroiliac joints are unremarkable. Soft tissues appear normal. IMPRESSION: 1. No acute displaced fracture. 2. Mild symmetrical bilateral hip osteoarthritis. Electronically Signed   By: Bobbye Burrow M.D.   On: 05/22/2024 08:25    PROCEDURES:  Critical Care performed: No  Procedures   MEDICATIONS ORDERED IN ED: Medications  cefTRIAXone  (ROCEPHIN) 2 g in sodium chloride 0.9 % 100 mL IVPB (has no administration in time range)  acetaminophen (TYLENOL) tablet 650 mg (has no administration in time range)  ibuprofen (ADVIL) tablet 600 mg (has no administration in time range)  sodium chloride 0.9 % bolus 1,000 mL (0 mLs Intravenous Stopped 05/22/24 1010)  gadobutrol (GADAVIST) 1 MMOL/ML injection 10 mL (10 mLs Intravenous Contrast Given 05/22/24 1329)     IMPRESSION / MDM / ASSESSMENT AND PLAN / ED COURSE  I reviewed the triage vital signs and the nursing notes.  Differential diagnosis includes, but is not limited to, electrolyte abnormality, rhabdomyolysis, UTI, anemia, traumatic injury, lower suspicion acute spinal cord pathology in the absence of back pain or acute lower back trauma, no lateralizing deficits suggestive of acute stroke  Patient's presentation is most consistent with acute presentation with potential threat to life or bodily function.  58 year old male presenting with lower extremity weakness in the setting of immobility for the past 3 days.  Will obtain labs, give IV fluids, obtain x-rays of the extremities to further evaluate.  Clinical Course as of 05/22/24 1552  Wed May 22, 2024  1114 CK Normal CK, presentation not consistent with rhabdomyolysis.  Patient reassessed.  Continues to have significant proximal lower extremity weakness.  No bowel or bladder symptoms, does report some low back pain.  Also noted to have some erythema over his right lower extremity and swelling of bilateral lower extremities.  Will obtain ultrasounds of the bilateral lower extremities, review case with neurology. [NR]  1117 Case discussed with Dr. Arora. Recommends MRI L-spine with and without contrast.  [NR]  1533 MR Lumbar Spine W Wo Contrast MRI without obvious spinal cord compression.  Case reviewed with Dr. Mont Antis who reviewed the patient's imaging.  He does not suspect that patient's presentation is related to acute spinal  pathology, recommends further investigations into alternative etiologies.  [NR]  1538 Patient reassessed.  Continues to have significant leg weakness.  Unclear exact etiology, but do think patient is appropriate for admission for further evaluation.  Also suspect cellulitis of his right lower extremity.  Will order antibiotics and reach out to hospitalist team. [NR]  1550 Case discussed with hospitalist team.  They will evaluate for anticipated admission. [NR]    Clinical Course User Index [NR] Claria Crofts, MD     FINAL CLINICAL IMPRESSION(S) / ED DIAGNOSES   Final diagnoses:  Weakness of both lower extremities  Cellulitis of right lower extremity     Rx / DC Orders   ED Discharge Orders     None        Note:  This document was prepared using Dragon voice recognition software and may include unintentional dictation errors.   Claria Crofts, MD 05/22/24 [phone]

## 2024-05-22 NOTE — ED Triage Notes (Signed)
 Pt presents to ED for evaluation of three days of bilateral leg swelling and weakness.

## 2024-05-22 NOTE — H&P (Signed)
 History and Physical    Charles Park LOV:[id number] DOB: 05-09-1966 DOA: 05/22/2024  PCP: Patient, No Pcp Per (Confirm with patient/family/NH records and if not entered, this has to be entered at [facility] point of entry) Patient coming from: Home  I have personally briefly reviewed patient's old medical records in [facility]  Chief Complaint: Pain and weakness of bilateral lower extremities  HPI: Charles Park is a 58 y.o. male with medical history significant of morbid obesity, HTN, presented with worsening of complaints about worsening bilateral leg pain, for knee pain and bilateral feet pain.  Symptoms started 5 days ago, patient started to have bilateral lower extremity pains distributed on bilateral hips bilateral knees and bilateral feet, had to use crutches to ambulate for 2 days.  3 days ago, symptoms became worse and patient found it was extremely difficult to even standing on his feet with excruciating pain of bilateral hips bilateral knees and bilateral feet, as result he has been staying in the recliner for the last 3 days without being to walk.  He denied any numbness or weakness of bilateral lower extremities.  Family also found patient has had increasing swelling and rash of bilateral lower extremities more on the right side below the knees.  Denies any fever chills no shortness of breath or cough.  Denies any trouble urinating or bowel movement.  No numbness or tingling   ED Course: Blood pressure elevated 160/90, not tachycardia afebrile.  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  Neurosurgeon reviewed lumbar MRI and recommend conservative management.    Review of  Systems: As per HPI otherwise 14 point review of systems negative.   No past medical history on file.   reports that he has quit smoking. He has never used smokeless tobacco. No history on file for alcohol use and drug use.  No Known Allergies  No family history on file.   Prior to Admission medications   Not on File    Physical Exam: Vitals:   05/22/24 1200 05/22/24 1411 05/22/24 1435 05/22/24 1530  BP: (!) 141/82  (!) 166/105 (!) 162/89  Pulse: 93  92 95  Resp: 19  (!) 25 (!) 22  Temp:  98.9 F (37.2 C)    TempSrc:      SpO2: 98%  90% 97%  Weight:      Height:        Constitutional: NAD, calm, comfortable Vitals:   05/22/24 1200 05/22/24 1411 05/22/24 1435 05/22/24 1530  BP: (!) 141/82  (!) 166/105 (!) 162/89  Pulse: 93  92 95  Resp: 19  (!) 25 (!) 22  Temp:  98.9 F (37.2 C)    TempSrc:      SpO2: 98%  90% 97%  Weight:      Height:       Eyes: PERRL, lids and conjunctivae normal ENMT: Mucous membranes are moist. Posterior pharynx clear of any exudate or lesions.Normal dentition.  Neck: normal, supple, no masses, no thyromegaly Respiratory: clear to auscultation bilaterally, no wheezing, no crackles. Normal respiratory effort. No accessory muscle use.  Cardiovascular: Regular rate and rhythm, no murmurs / rubs / gallops.  1+ extremity edema.  2+ pedal pulses. No carotid bruits.  Abdomen: no tenderness, no masses palpated. No hepatosplenomegaly. Bowel sounds positive.  Musculoskeletal: no clubbing / cyanosis. No joint deformity upper and lower extremities. Good ROM, no contractures. Normal muscle tone.  Skin: Rash and warm to touch with tenderness of bilateral shin area Neurologic: CN 2-12 grossly intact. Sensation intact, DTR normal. Strength 3/5 bilateral hips, muscle strength 4/5 on bilateral lower extremities below the hips.  Psychiatric: Normal judgment and insight. Alert and oriented x 3. Normal mood.    Labs on Admission: I have personally reviewed  following labs and imaging studies  CBC: Recent Labs  Lab 05/22/24 0741  WBC 10.3  NEUTROABS 8.1*  HGB 14.2  HCT 43.1  MCV 94.5  PLT 265   Basic Metabolic Panel: Recent Labs  Lab 05/22/24 0848 05/22/24 1013  NA  --  136  K  --  3.7  CL  --  103  CO2  --  21*  GLUCOSE  --  96  BUN  --  33*  CREATININE  --  0.89  CALCIUM  --  9.1  MG 2.5*  --    GFR: Estimated Creatinine Clearance: 147.9 mL/min (by C-G formula based on SCr of 0.89 mg/dL). Liver Function Tests: Recent Labs  Lab 05/22/24 1013  AST 40  ALT 57*  ALKPHOS 90  BILITOT 1.5*  PROT 8.3*  ALBUMIN 3.1*   No results for input(s): "LIPASE", "AMYLASE" in the last 168 hours. No results for input(s): "AMMONIA" in the last 168 hours. Coagulation Profile: No results for input(s): "INR", "PROTIME" in the last 168 hours. Cardiac Enzymes: Recent Labs  Lab 05/22/24 1013  CKTOTAL 87   BNP (last 3 results) No results for input(s): "PROBNP" in the last 8760 hours. HbA1C: No results for input(s): "HGBA1C" in the last 72 hours. CBG: No results for input(s): "GLUCAP" in the last 168 hours. Lipid Profile: No results for input(s): "CHOL", "HDL", "LDLCALC", "TRIG", "CHOLHDL", "LDLDIRECT" in the last 72 hours. Thyroid Function Tests: No results for input(s): "TSH", "T4TOTAL", "FREET4", "T3FREE", "THYROIDAB" in the last 72 hours. Anemia Panel: No results for input(s): "VITAMINB12", "FOLATE", "FERRITIN", "TIBC", "IRON", "RETICCTPCT" in the last 72 hours. Urine analysis:    Component Value Date/Time   COLORURINE AMBER (A) 05/22/2024 0741   APPEARANCEUR HAZY (A) 05/22/2024 0741   LABSPEC 1.025 05/22/2024 0741   PHURINE 5.0 05/22/2024 0741   GLUCOSEU NEGATIVE 05/22/2024 0741   HGBUR SMALL (A) 05/22/2024 0741   BILIRUBINUR NEGATIVE 05/22/2024 0741   KETONESUR NEGATIVE 05/22/2024 0741   PROTEINUR 30 (A) 05/22/2024 0741   NITRITE NEGATIVE 05/22/2024 0741   LEUKOCYTESUR NEGATIVE 05/22/2024 0741    Radiological  Exams on Admission: MR Lumbar Spine W Wo Contrast Result Date: 05/22/2024 CLINICAL DATA:  Myelopathy, acute, lumbar spine. Bilateral proximal lower extremity weakness. EXAM: MRI LUMBAR SPINE WITHOUT AND WITH CONTRAST TECHNIQUE: Multiplanar and multiecho pulse sequences of the lumbar spine were obtained without and with intravenous contrast. CONTRAST:  10mL GADAVIST GADOBUTROL 1 MMOL/ML IV SOLN COMPARISON:  None Available. FINDINGS: Segmentation:  Standard. Alignment:  Normal. Vertebrae: No fracture, suspicious marrow lesion, or significant marrow edema. Moderate Modic type 2 endplate changes at L5-S1. Conus medullaris and cauda equina: Conus extends to the L1-2 level. Conus and cauda equina appear normal. Paraspinal and other soft tissues: Unremarkable. Disc levels: T12-L1: Mild facet hypertrophy without disc herniation or stenosis. L1-2: Moderate facet hypertrophy without disc herniation or stenosis. L2-3: Minimal disc bulging and moderate facet and ligamentum flavum hypertrophy  without stenosis. L3-4: Disc desiccation and mild disc space narrowing. Disc bulging and severe facet and ligamentum flavum hypertrophy result in mild spinal stenosis and moderate right and moderate to severe left neural foraminal stenosis. L4-5: Disc desiccation and mild disc space narrowing. Disc bulging, a central disc protrusion with annular fissure, and mild-to-moderate facet and ligamentum flavum hypertrophy result in mild spinal stenosis and moderate bilateral neural foraminal stenosis. L5-S1: Disc desiccation and moderate disc space narrowing. Circumferential disc bulging and mild-to-moderate facet hypertrophy result in moderate bilateral neural foraminal stenosis without spinal stenosis. IMPRESSION: 1. 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 2. Moderate to severe neural foraminal stenosis at L3-4 and moderate foraminal stenosis at L4-5 and L5-S1. Electronically Signed   By: Aundra Lee M.D.   On: 05/22/2024 15:14   US  Venous Img Lower Bilateral Result Date: 05/22/2024 CLINICAL DATA:  Lower extremity swelling for 4 days EXAM: Bilateral Lower Extremity Venous Doppler Ultrasound TECHNIQUE: Gray-scale sonography with compression, as well as color and duplex ultrasound, were performed to evaluate the deep venous system(s) from the level of the common femoral vein through the popliteal and proximal calf veins. COMPARISON:  None available FINDINGS: VENOUS Normal compressibility of the common femoral, superficial femoral, and popliteal veins, as well as the visualized calf veins. Visualized portions of profunda femoral vein and great saphenous vein unremarkable. No filling defects to suggest DVT on grayscale or color Doppler imaging. Doppler waveforms show normal direction of venous flow, normal respiratory plasticity and response to augmentation. OTHER None. Limitations: Limited visualization of the calf veins. IMPRESSION: No lower extremity DVT. Electronically Signed   By: Elester Grim M.D.   On: 05/22/2024 12:24   DG Ankle Complete Right Result Date: 05/22/2024 CLINICAL DATA:  Tripped and fell, bilateral lower extremity swelling and pain EXAM: RIGHT ANKLE - COMPLETE 3+ VIEW; LEFT ANKLE COMPLETE - 3+ VIEW COMPARISON:  None Available. FINDINGS: Left ankle: Frontal, oblique, and lateral views are obtained. There are no acute displaced fractures. Moderate osteoarthritis throughout the ankle, hindfoot, and visualized midfoot. Pes planus deformity. Diffuse subcutaneous edema. Prominent inferior calcaneal spur. Right ankle: Frontal, oblique, and lateral views are obtained. No acute displaced fractures. Moderate to severe osteoarthritis throughout the ankle, hindfoot, and visualized midfoot. Prominent inferior calcaneal spur. Pes planus deformity. Diffuse soft tissue edema. IMPRESSION: 1. No acute displaced fractures within either ankle. 2. Symmetrical osteoarthritis of the bilateral ankles, hind  feet, and mid feet. 3. Pes planus deformity bilaterally. 4. Extensive bilateral soft tissue edema. Electronically Signed   By: Bobbye Burrow M.D.   On: 05/22/2024 08:28   DG Ankle Complete Left Result Date: 05/22/2024 CLINICAL DATA:  Tripped and fell, bilateral lower extremity swelling and pain EXAM: RIGHT ANKLE - COMPLETE 3+ VIEW; LEFT ANKLE COMPLETE - 3+ VIEW COMPARISON:  None Available. FINDINGS: Left ankle: Frontal, oblique, and lateral views are obtained. There are no acute displaced fractures. Moderate osteoarthritis throughout the ankle, hindfoot, and visualized midfoot. Pes planus deformity. Diffuse subcutaneous edema. Prominent inferior calcaneal spur. Right ankle: Frontal, oblique, and lateral views are obtained. No acute displaced fractures. Moderate to severe osteoarthritis throughout the ankle, hindfoot, and visualized midfoot. Prominent inferior calcaneal spur. Pes planus deformity. Diffuse soft tissue edema. IMPRESSION: 1. No acute displaced fractures within either ankle. 2. Symmetrical osteoarthritis of the bilateral ankles, hind feet, and mid feet. 3. Pes planus deformity bilaterally. 4. Extensive bilateral soft tissue edema. Electronically Signed   By: Bari Boos.D.  On: 05/22/2024 08:28   DG Knee Complete 4 Views Left Result Date: 05/22/2024 CLINICAL DATA:  Tripped and fell last week, bilateral leg swelling and pain EXAM: LEFT KNEE - COMPLETE 4+ VIEW; RIGHT KNEE - COMPLETE 4+ VIEW COMPARISON:  07/03/2019 FINDINGS: Left knee: Frontal, bilateral oblique, and cross-table lateral views are obtained. No acute displaced fracture, subluxation, or dislocation. There is mild 3 compartmental osteoarthritis greatest in the patellofemoral compartment. Moderate joint effusion. Diffuse subcutaneous edema. Right knee: Frontal, bilateral oblique, and cross-table lateral views are obtained. No acute fracture, subluxation, or dislocation. Mild 3 compartmental osteoarthritis greatest in the medial and  patellofemoral compartments. Moderate joint effusion. Diffuse subcutaneous edema. IMPRESSION: 1. Bilateral 3 compartmental osteoarthritis and moderate knee effusions. 2. No acute displaced fractures. 3. Diffuse subcutaneous edema bilaterally. Electronically Signed   By: Bobbye Burrow M.D.   On: 05/22/2024 08:26   DG Knee Complete 4 Views Right Result Date: 05/22/2024 CLINICAL DATA:  Tripped and fell last week, bilateral leg swelling and pain EXAM: LEFT KNEE - COMPLETE 4+ VIEW; RIGHT KNEE - COMPLETE 4+ VIEW COMPARISON:  07/03/2019 FINDINGS: Left knee: Frontal, bilateral oblique, and cross-table lateral views are obtained. No acute displaced fracture, subluxation, or dislocation. There is mild 3 compartmental osteoarthritis greatest in the patellofemoral compartment. Moderate joint effusion. Diffuse subcutaneous edema. Right knee: Frontal, bilateral oblique, and cross-table lateral views are obtained. No acute fracture, subluxation, or dislocation. Mild 3 compartmental osteoarthritis greatest in the medial and patellofemoral compartments. Moderate joint effusion. Diffuse subcutaneous edema. IMPRESSION: 1. Bilateral 3 compartmental osteoarthritis and moderate knee effusions. 2. No acute displaced fractures. 3. Diffuse subcutaneous edema bilaterally. Electronically Signed   By: Bobbye Burrow M.D.   On: 05/22/2024 08:26   DG Hip Unilat W or Wo Pelvis 2-3 Views Right Result Date: 05/22/2024 CLINICAL DATA:  Marvell Slider, pain EXAM: DG HIP (WITH OR WITHOUT PELVIS) 2-3V RIGHT COMPARISON:  None Available. FINDINGS: Frontal view of the pelvis as well as frontal and frogleg lateral views of the right hip are obtained. Assessment is slightly limited by patient body habitus. No evidence of acute fracture, subluxation, or dislocation. Mild symmetrical bilateral hip osteoarthritis. Sacroiliac joints are unremarkable. Soft tissues appear normal. IMPRESSION: 1. No acute displaced fracture. 2. Mild symmetrical bilateral hip  osteoarthritis. Electronically Signed   By: Bobbye Burrow M.D.   On: 05/22/2024 08:25    EKG: Pending  Assessment/Plan Principal Problem:   Impaired ambulation  (please populate well all problems here in Problem List. (For example, if patient is on BP meds at home and you resume or decide to hold them, it is a problem that needs to be her. Same for CAD, COPD, HLD and so on)   Acute ambulation impairment -Etiology appears to be worsening of bilateral multijoint OA's including bilateral hips bilateral knees and bilateral feet hind and midfoot small joints as shown on the x-ray. -Pain management, Tylenol-ibuprofen-oxycodone -PT evaluation -Consult about weight loss -Other DDx, CK level normal, myositis unlikely, will check TSH and uric acid level.  MRI showed signs of foraminal stenosis of level L3-S1, MRI was reviewed by neurosurgeon and who recommended conservative management.  On physical exam patient has a negative straight leg test and does not complain about any shooting leg pain, sciatica appears to be like slightly to cause his ambulation problems at this point.  DVT study negative.  Bilateral lower extremity cellulitis - Involving more than one third of the surface area of bilateral lower extremities - Ancef - Check strep a PCR  Morbid  obesity - BMI 43 - Outpatient GLP-1 therapy evaluation.   DVT prophylaxis: Lovenox Code Status: Full code Family Communication: Wife at bedside Disposition Plan: Patient is sick with significant ambulation impairment, with severe bilateral lower extremity multiple joints severe OA requiring inpatient pain management, expect more than 2 midnight hospital stay Consults called: ED physician discussed the case with neurology and neurosurgery Admission status: MedSurg admission   [place] MD [facility] Pager [phone]  05/22/2024, 4:31 PM

## 2024-05-22 NOTE — Progress Notes (Signed)
 PHARMACIST - PHYSICIAN COMMUNICATION  CONCERNING:  Enoxaparin  (Lovenox ) for DVT Prophylaxis    RECOMMENDATION: Patient was prescribed enoxaprin 40mg  q24 hours for VTE prophylaxis.   Filed Weights   05/22/24 0727  Weight: (!) 158.8 kg (350 lb)    Body mass index is 43.75 kg/m.  Estimated Creatinine Clearance: 147.9 mL/min (by C-G formula based on SCr of 0.89 mg/dL).   Based on [facility] policy patient is candidate for enoxaparin  0.5mg /kg TBW SQ every 24 hours based on BMI being >30.   DESCRIPTION: Pharmacy has adjusted enoxaparin  dose per [facility] policy.  Patient is now receiving enoxaparin  80 mg every 24 hours    Ramonita Burow, PharmD Clinical Pharmacist  05/22/2024 4:27 PM

## 2024-05-23 DIAGNOSIS — R262 Difficulty in walking, not elsewhere classified: Secondary | ICD-10-CM

## 2024-05-23 LAB — HIV ANTIBODY (ROUTINE TESTING W REFLEX): HIV Screen 4th Generation wRfx: NONREACTIVE

## 2024-05-23 NOTE — Plan of Care (Signed)
   Problem: Education: Goal: Knowledge of General Education information will improve Description: Including pain rating scale, medication(s)/side effects and non-pharmacologic comfort measures Outcome: Progressing   Problem: Health Behavior/Discharge Planning: Goal: Ability to manage health-related needs will improve Outcome: Progressing   Problem: Clinical Measurements: Goal: Ability to maintain clinical measurements within normal limits will improve Outcome: Progressing Goal: Will remain free from infection Outcome: Progressing Goal: Diagnostic test results will improve Outcome: Progressing Goal: Respiratory complications will improve Outcome: Progressing Goal: Cardiovascular complication will be avoided Outcome: Progressing   Problem: Activity: Goal: Risk for activity intolerance will decrease Outcome: Progressing   Problem: Nutrition: Goal: Adequate nutrition will be maintained Outcome: Progressing   Problem: Coping: Goal: Level of anxiety will decrease Outcome: Progressing   Problem: Elimination: Goal: Will not experience complications related to bowel motility Outcome: Progressing Goal: Will not experience complications related to urinary retention Outcome: Progressing   Problem: Pain Managment: Goal: General experience of comfort will improve and/or be controlled Outcome: Progressing   Problem: Safety: Goal: Ability to remain free from injury will improve Outcome: Progressing   Problem: Skin Integrity: Goal: Risk for impaired skin integrity will decrease Outcome: Progressing   Problem: Clinical Measurements: Goal: Ability to avoid or minimize complications of infection will improve Outcome: Progressing   Problem: Skin Integrity: Goal: Skin integrity will improve Outcome: Progressing   Problem: Clinical Measurements: Goal: Ability to avoid or minimize complications of infection will improve Outcome: Progressing   Problem: Skin Integrity: Goal: Skin  integrity will improve Outcome: Progressing

## 2024-05-23 NOTE — Evaluation (Signed)
 Physical Therapy Evaluation Patient Details Name: Charles Park MRN: [id number] DOB: 1966-09-20 Today's Date: 05/23/2024  History of Present Illness  Charles Park is a 58 y.o. male with medical history significant of morbid obesity, HTN, presented with worsening of complaints about worsening bilateral leg pain, for knee pain and bilateral feet pain.  Clinical Impression  Pt is a pleasant 58 year old male who was admitted for impaired ambulation. Pt performs bed mobility with max assist and transfers with total assist-unsuccessful. Pt demonstrates deficits with pain/strength/mobility. Pt is currently not at baseline level. Would benefit from skilled PT to address above deficits and promote optimal return to PLOF. Pt will continue to receive skilled PT services while admitted and will defer to TOC/care team for updates regarding disposition planning.         If plan is discharge home, recommend the following: Two people to help with walking and/or transfers;Two people to help with bathing/dressing/bathroom;Assist for transportation;Help with stairs or ramp for entrance   Can travel by private vehicle   No    Equipment Recommendations  (TBD)  Recommendations for Other Services       Functional Status Assessment Patient has had a recent decline in their functional status and demonstrates the ability to make significant improvements in function in a reasonable and predictable amount of time.     Precautions / Restrictions Precautions Precautions: Fall Recall of Precautions/Restrictions: Intact Restrictions Weight Bearing Restrictions Per Provider Order: No      Mobility  Bed Mobility Overal bed mobility: Needs Assistance Bed Mobility: Supine to Sit     Supine to sit: Max assist     General bed mobility comments: heavy assist with B LE. Once seated, required bed significantly elevated due to pain. Able to maintain seated balance for extended time    Transfers Overall  transfer level: Needs assistance Equipment used: Rolling walker (2 wheels) Transfers: Sit to/from Stand Sit to Stand: Total assist           General transfer comment: several attempts to stand, unsuccessful despite elevated bed and BRW used    Ambulation/Gait                  Stairs            Wheelchair Mobility     Tilt Bed    Modified Rankin (Stroke Patients Only)       Balance Overall balance assessment: Needs assistance Sitting-balance support: Feet supported, No upper extremity supported Sitting balance-Leahy Scale: Good                                       Pertinent Vitals/Pain Pain Assessment Pain Assessment: 0-10 Pain Score: 10-Worst pain ever Pain Location: Bilateral knees Pain Descriptors / Indicators: Discomfort, Grimacing Pain Intervention(s): Limited activity within patient's tolerance    Home Living Family/patient expects to be discharged to:: Private residence Living Arrangements: Spouse/significant other Available Help at Discharge: Family;Other (Comment) (niece) Type of Home: House Home Access: Stairs to enter;Ramped entrance   Entrance Stairs-Number of Steps: 2   Home Layout: One level Home Equipment: Agricultural consultant (2 wheels);Wheelchair - manual;Crutches Additional Comments: has bariatric size    Prior Function Prior Level of Function : Independent/Modified Independent;Driving             Mobility Comments: indep, reports no falls. Hasn't been able to walk in past 5 days due to weakness and pain  ADLs Comments: indep     Extremity/Trunk Assessment   Upper Extremity Assessment Upper Extremity Assessment: Overall WFL for tasks assessed    Lower Extremity Assessment Lower Extremity Assessment: Generalized weakness (B LE grossly 2/5 and pain limited) RLE: Unable to fully assess due to pain LLE: Unable to fully assess due to pain    Cervical / Trunk Assessment Cervical / Trunk Assessment: Normal   Communication   Communication Communication: No apparent difficulties    Cognition Arousal: Alert Behavior During Therapy: WFL for tasks assessed/performed   PT - Cognitive impairments: No apparent impairments                       PT - Cognition Comments: pleasant and agreeable to sessino Following commands: Intact       Cueing Cueing Techniques: Verbal cues     General Comments General comments (skin integrity, edema, etc.): Pt very deconditioned, in lots of pain limiting OOB mobility    Exercises Other Exercises Other Exercises: educated on skin integrity including changing positions in addition to HEP (LAQ while seated). 10 reps performed   Assessment/Plan    PT Assessment Patient needs continued PT services  PT Problem List Decreased strength;Decreased balance;Decreased activity tolerance;Decreased mobility;Pain       PT Treatment Interventions DME instruction;Gait training;Stair training;Therapeutic activities;Therapeutic exercise;Balance training    PT Goals (Current goals can be found in the Care Plan section)  Acute Rehab PT Goals Patient Stated Goal: to get stronger PT Goal Formulation: With patient Time For Goal Achievement: 06/06/24 Potential to Achieve Goals: Good    Frequency Min 2X/week     Co-evaluation               AM-PAC PT "6 Clicks" Mobility  Outcome Measure Help needed turning from your back to your side while in a flat bed without using bedrails?: A Lot Help needed moving from lying on your back to sitting on the side of a flat bed without using bedrails?: A Lot Help needed moving to and from a bed to a chair (including a wheelchair)?: Total Help needed standing up from a chair using your arms (e.g., wheelchair or bedside chair)?: Total Help needed to walk in hospital room?: Total Help needed climbing 3-5 steps with a railing? : Total 6 Click Score: 8    End of Session   Activity Tolerance: Patient limited by  pain Patient left: in bed Nurse Communication: Mobility status PT Visit Diagnosis: Difficulty in walking, not elsewhere classified (R26.2);Muscle weakness (generalized) (M62.81);Pain Pain - Right/Left:  (bilat) Pain - part of body: Leg    Time: 1610-9604 PT Time Calculation (min) (ACUTE ONLY): 28 min   Charges:   PT Evaluation $PT Eval Low Complexity: 1 Low PT Treatments $Therapeutic Activity: 8-22 mins PT General Charges $$ ACUTE PT VISIT: 1 Visit         Charles Park, PT, DPT, GCS [phone]   Wanda Rideout 05/23/2024, 1:07 PM

## 2024-05-23 NOTE — Progress Notes (Signed)
 Occupational Therapy Evaluation Patient Details Name: Charles Park MRN: [id number] DOB: 1966-06-11 Today's Date: 05/23/2024   History of Present Illness   Charles Park is a 58 y.o. male with medical history significant of morbid obesity, HTN, presented with worsening of complaints about worsening bilateral leg pain, for knee pain and bilateral feet pain.     Clinical Impressions Pt was seen for OT evaluation this date. On arrival to room pt sitting on the EOB with PT. Prior to hospital admission, pt states the last 5 days he hasn't been able to walk due to bilateral knee, hip, and feet pain. Prior to increases pain level pt was indep. Pt lives with his niece in a one level home with 2 steps to enter. Pt presents to acute OT demonstrating impaired ADL performance and functional mobility 2/2 (See OT problem list for additional functional deficits). Pt currently requires MAXA donning bilateral socks at bed level, MAXA +2 to return to supine, very effortful for pt. Pt attempted STS was PT prior to OT arrival, pt was too fatigued/increased pain levels to further attempted standing on this date. Pt completed grooming tasks while seated on the EOB with set up assistance prior to returning to supine. Pt would benefit from skilled OT services to address noted impairments and functional limitations (see below for any additional details) in order to maximize safety and independence while minimizing falls risk and caregiver burden. OT will follow acutely.     If plan is discharge home, recommend the following:   Two people to help with walking and/or transfers;A lot of help with bathing/dressing/bathroom;Assist for transportation;Help with stairs or ramp for entrance;Assistance with cooking/housework     Functional Status Assessment   Patient has had a recent decline in their functional status and demonstrates the ability to make significant improvements in function in a reasonable and predictable  amount of time.     Equipment Recommendations   Other (comment) (Defer to next venue of care)     Recommendations for Other Services         Precautions/Restrictions   Precautions Precautions: Fall Recall of Precautions/Restrictions: Intact Restrictions Weight Bearing Restrictions Per Provider Order: No     Mobility Bed Mobility Overal bed mobility: Needs Assistance Bed Mobility: Sit to Supine       Sit to supine: Max assist (LE management, +3 to boost pt up in bed and reposition)   General bed mobility comments: Fair UE strength, pt fatigued from grooming tasks/PT session, reported his UE are too weak to assist in bed mobility    Transfers Overall transfer level: Needs assistance Equipment used: Rolling walker (2 wheels)               General transfer comment: Pt sitting on EOB with PT on arrival to room, reports of multiple STS attempts but pt is unable to stand on this date. Will continue to assess functional transfers.      Balance Overall balance assessment: Needs assistance Sitting-balance support: Feet supported, No upper extremity supported Sitting balance-Leahy Scale: Good Sitting balance - Comments: Good reaching within BOS                                   ADL either performed or assessed with clinical judgement   ADL Overall ADL's : Needs assistance/impaired Eating/Feeding: Set up;Sitting Eating/Feeding Details (indicate cue type and reason): Sitting on EOB Grooming: Wash/dry face;Wash/dry hands;Oral care;Sitting;Set up Grooming Details (  indicate cue type and reason): EOB sitting             Lower Body Dressing: Maximal assistance;Bed level Lower Body Dressing Details (indicate cue type and reason): Donning socks at bed level               General ADL Comments: Seated ADL tasks on the EOB, set up required                                Pertinent Vitals/Pain Pain Assessment Pain Assessment:  0-10 Pain Score: 10-Worst pain ever Pain Location: Bilateral knees Pain Descriptors / Indicators: Discomfort, Grimacing Pain Intervention(s): Repositioned, Monitored during session, Limited activity within patient's tolerance, RN gave pain meds during session     Extremity/Trunk Assessment Upper Extremity Assessment Upper Extremity Assessment: Overall WFL for tasks assessed   Lower Extremity Assessment Lower Extremity Assessment: Generalized weakness (B LE grossly 2/5 and pain limited) RLE: Unable to fully assess due to pain LLE: Unable to fully assess due to pain   Cervical / Trunk Assessment Cervical / Trunk Assessment: Normal   Communication Communication Communication: No apparent difficulties   Cognition Arousal: Alert Behavior During Therapy: WFL for tasks assessed/performed Cognition: No apparent impairments             OT - Cognition Comments: A/OX4                 Following commands: Intact       Cueing  General Comments   Cueing Techniques: Verbal cues  Pt very deconditioned, in lots of pain limiting OOB mobility   Exercises Exercises: Other exercises Other Exercises Other Exercises: Edu: Role of OT eval, safe ADL completion, pain management techniques   Shoulder Instructions      Home Living Family/patient expects to be discharged to:: Private residence Living Arrangements: Spouse/significant other Available Help at Discharge: Family;Other (Comment) (niece) Type of Home: House Home Access: Stairs to enter;Ramped entrance Entrance Stairs-Number of Steps: 2   Home Layout: One level               Home Equipment: Agricultural consultant (2 wheels);Wheelchair - manual;Crutches   Additional Comments: has bariatric size      Prior Functioning/Environment Prior Level of Function : Independent/Modified Independent;Driving             Mobility Comments: indep, reports no falls. Hasn't been able to walk in past 5 days due to weakness and  pain ADLs Comments: indep    OT Problem List: Decreased strength;Decreased activity tolerance;Impaired balance (sitting and/or standing);Decreased coordination;Decreased safety awareness;Decreased knowledge of use of DME or AE;Pain   OT Treatment/Interventions: Self-care/ADL training;Energy conservation;DME and/or AE instruction;Therapeutic activities;Patient/family education;Balance training      OT Goals(Current goals can be found in the care plan section)   Acute Rehab OT Goals Patient Stated Goal: Have less pain OT Goal Formulation: With patient Time For Goal Achievement: 06/06/24 Potential to Achieve Goals: Good ADL Goals Pt Will Perform Grooming: standing;with max assist Pt Will Perform Lower Body Dressing: sit to/from stand;with max assist Pt Will Transfer to Toilet: bedside commode;stand pivot transfer;with +2 assist Pt Will Perform Toileting - Clothing Manipulation and hygiene: with max assist;sit to/from stand   OT Frequency:  Min 2X/week    Co-evaluation              AM-PAC OT "6 Clicks" Daily Activity     Outcome Measure Help from another person  eating meals?: A Little Help from another person taking care of personal grooming?: A Lot Help from another person toileting, which includes using toliet, bedpan, or urinal?: A Lot Help from another person bathing (including washing, rinsing, drying)?: A Lot Help from another person to put on and taking off regular upper body clothing?: None Help from another person to put on and taking off regular lower body clothing?: A Lot 6 Click Score: 15   End of Session Equipment Utilized During Treatment: Rolling walker (2 wheels) Nurse Communication: Mobility status  Activity Tolerance: Patient limited by pain Patient left: with bed alarm set;with nursing/sitter in room;in bed  OT Visit Diagnosis: Unsteadiness on feet (R26.81);Other abnormalities of gait and mobility (R26.89);Muscle weakness (generalized)  (M62.81);Pain Pain - part of body: Knee;Hip;Ankle and joints of foot                Time: 1610-9604 OT Time Calculation (min): 20 min Charges:  OT General Charges $OT Visit: 1 Visit OT Evaluation $OT Eval Low Complexity: 1 Low OT Treatments $Self Care/Home Management : 8-22 mins  Rosaria Common M.S. OTR/L  05/23/24, 1:53 PM

## 2024-05-23 NOTE — TOC Progression Note (Signed)
 Transition of Care [facility]) - Progression Note    Patient Details  Name: Charles Park MRN: [id number] Date of Birth: 05-Jul-1966  Transition of Care [facility]) CM/SW Contact  Alexandra Ice, RN Phone Number: 05/23/2024, 10:42 AM  Clinical Narrative:     Patient has no insurance listed, sent email to financial navigator to verify.        Expected Discharge Plan and Services                                               Social Determinants of Health (SDOH) Interventions SDOH Screenings   Food Insecurity: Food Insecurity Present (05/22/2024)  Housing: Low Risk  (05/22/2024)  Transportation Needs: No Transportation Needs (05/22/2024)  Utilities: At Risk (05/22/2024)  Tobacco Use: Medium Risk (05/22/2024)    Readmission Risk Interventions     No data to display

## 2024-05-23 NOTE — Progress Notes (Signed)
 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 US  of b/l LE neg for DVT. Tylenol, oxy prn for pain. PT/OT consulted. Neuro surg recommended conservative management.    Bilateral lower extremity cellulitis: continue on IV ancef.    Morbid obesity: BMI 43.7. Complicates overall care & prognosis       DVT prophylaxis: lovenox  Code Status: full  Family Communication: discussed pt's care w/ pt's family at bedside and answered their questions  Disposition Plan: possible SNF   Level of care: Med-Surg   Status is: Inpatient Remains inpatient appropriate because: severity of illness    Consultants:    Procedures:   Antimicrobials: ancef   Subjective: Pt c/o generalized weakness  Objective: Vitals:   05/22/24 1728 05/22/24 1936 05/23/24 0501 05/23/24 0741  BP: (!) 146/80 130/64 136/65 (!) 145/75  Pulse: 97 96 84 85  Resp: 16 17 18 16   Temp: 98.9 F (37.2 C) 98.7 F (37.1 C) 98.5 F (36.9 C) 97.9 F (36.6 C)  TempSrc: Oral Oral Oral   SpO2: 98% 94% 95% 95%  Weight:      Height:        Intake/Output Summary (Last 24 hours) at 05/23/2024 0846 Last data filed at 05/23/2024 0500 Gross per 24 hour  Intake 1440 ml  Output 2300 ml  Net -860 ml   Filed Weights   05/22/24 0727  Weight: (!) 158.8 kg    Examination:  General exam: Appears calm and comfortable. Morbid obesity   Respiratory system: Clear to auscultation. Respiratory effort normal. Cardiovascular system: S1 & S2+. No rubs, gallops or clicks.  Gastrointestinal system: Abdomen is obese, soft and nontender.Normal bowel sounds heard. Central nervous system: Alert and oriented. Moves all extremities Psychiatry: Judgement and insight appear normal. Flat mood and affect      Data Reviewed: I have personally reviewed following labs and imaging studies  CBC: Recent Labs  Lab 05/22/24 0741  WBC 10.3  NEUTROABS 8.1*  HGB 14.2  HCT 43.1  MCV 94.5  PLT 265   Basic Metabolic Panel: Recent Labs  Lab 05/22/24 0848 05/22/24 1013  NA  --  136  K  --  3.7  CL  --  103  CO2  --  21*  GLUCOSE  --  96  BUN  --  33*  CREATININE  --  0.89  CALCIUM  --  9.1  MG 2.5*  --    GFR: Estimated Creatinine Clearance: 147.9 mL/min (by C-G formula based on SCr of 0.89 mg/dL). Liver Function Tests: Recent Labs  Lab 05/22/24 1013  AST 40  ALT 57*  ALKPHOS 90  BILITOT 1.5*  PROT 8.3*  ALBUMIN 3.1*   No results for input(s): "LIPASE", "AMYLASE" in the last 168 hours. No results for input(s): "AMMONIA" in the last 168 hours. Coagulation Profile: No results for input(s): "INR", "PROTIME" in the last 168 hours. Cardiac Enzymes: Recent Labs  Lab 05/22/24 1013  CKTOTAL 87   BNP (last 3 results) No results for input(s): "PROBNP" in the last 8760 hours. HbA1C: No results for input(s): "HGBA1C" in the last 72 hours. CBG: No results for input(s): "GLUCAP" in the last 168 hours. Lipid Profile: No results for input(s): "CHOL", "HDL", "LDLCALC", "TRIG", "CHOLHDL", "LDLDIRECT" in the last 72 hours. Thyroid Function  Tests: No results for input(s): "TSH", "T4TOTAL", "FREET4", "T3FREE", "THYROIDAB" in the last 72 hours. Anemia Panel: No results for input(s): "VITAMINB12", "FOLATE", "FERRITIN", "TIBC", "IRON", "RETICCTPCT" in the last 72 hours. Sepsis Labs: No results for input(s): "PROCALCITON", "LATICACIDVEN" in the last 168 hours.  Recent Results (from the past 240 hours)  Group A Strep by PCR     Status: None   Collection Time: 05/22/24  4:24 PM   Specimen: Throat; Sterile Swab  Result Value Ref Range Status   Group A Strep by PCR NOT DETECTED NOT DETECTED Final    Comment: Performed at [facility], [address], [place], [place] [address]          Radiology Studies: MR Lumbar Spine W Wo Contrast Result Date: 05/22/2024 CLINICAL DATA:  Myelopathy, acute, lumbar spine. Bilateral proximal lower extremity weakness. EXAM: MRI LUMBAR SPINE WITHOUT AND WITH CONTRAST TECHNIQUE: Multiplanar and multiecho pulse sequences of the lumbar spine were obtained without and with intravenous contrast. CONTRAST:  10mL GADAVIST GADOBUTROL 1 MMOL/ML IV SOLN COMPARISON:  None Available. FINDINGS: Segmentation:  Standard. Alignment:  Normal. Vertebrae: No fracture, suspicious marrow lesion, or significant marrow edema. Moderate Modic type 2 endplate changes at L5-S1. Conus medullaris and cauda equina: Conus extends to the L1-2 level. Conus and cauda equina appear normal. Paraspinal and other soft tissues: Unremarkable. Disc levels: T12-L1: Mild facet hypertrophy without disc herniation or stenosis. L1-2: Moderate facet hypertrophy without disc herniation or stenosis. L2-3: Minimal disc bulging and moderate facet and ligamentum flavum hypertrophy without stenosis. L3-4: Disc desiccation and mild disc space narrowing. Disc bulging and severe facet and ligamentum flavum hypertrophy result in mild spinal stenosis and moderate right and moderate to severe left neural foraminal stenosis. L4-5: Disc desiccation and mild disc space narrowing. Disc bulging, a central disc protrusion with annular fissure, and mild-to-moderate facet and ligamentum flavum hypertrophy result in mild spinal stenosis and moderate bilateral neural foraminal stenosis. L5-S1: Disc desiccation and moderate disc space narrowing. Circumferential disc bulging and mild-to-moderate facet hypertrophy result in moderate bilateral neural foraminal stenosis without spinal stenosis. IMPRESSION: 1. 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 2. Moderate to severe neural foraminal stenosis at L3-4 and moderate foraminal stenosis at L4-5 and L5-S1. Electronically  Signed   By: Aundra Lee M.D.   On: 05/22/2024 15:14   US  Venous Img Lower Bilateral Result Date: 05/22/2024 CLINICAL DATA:  Lower extremity swelling for 4 days EXAM: Bilateral Lower Extremity Venous Doppler Ultrasound TECHNIQUE: Gray-scale sonography with compression, as well as color and duplex ultrasound, were performed to evaluate the deep venous system(s) from the level of the common femoral vein through the popliteal and proximal calf veins. COMPARISON:  None available FINDINGS: VENOUS Normal compressibility of the common femoral, superficial femoral, and popliteal veins, as well as the visualized calf veins. Visualized portions of profunda femoral vein and great saphenous vein unremarkable. No filling defects to suggest DVT on grayscale or color Doppler imaging. Doppler waveforms show normal direction of venous flow, normal respiratory plasticity and response to augmentation. OTHER None. Limitations: Limited visualization of the calf veins. IMPRESSION: No lower extremity DVT. Electronically Signed   By: Elester Grim M.D.   On: 05/22/2024 12:24   DG Ankle Complete Right Result Date: 05/22/2024 CLINICAL DATA:  Tripped and fell, bilateral lower extremity swelling and pain EXAM: RIGHT ANKLE - COMPLETE 3+ VIEW; LEFT ANKLE COMPLETE - 3+ VIEW COMPARISON:  None Available. FINDINGS: Left ankle: Frontal, oblique, and  lateral views are obtained. There are no acute displaced fractures. Moderate osteoarthritis throughout the ankle, hindfoot, and visualized midfoot. Pes planus deformity. Diffuse subcutaneous edema. Prominent inferior calcaneal spur. Right ankle: Frontal, oblique, and lateral views are obtained. No acute displaced fractures. Moderate to severe osteoarthritis throughout the ankle, hindfoot, and visualized midfoot. Prominent inferior calcaneal spur. Pes planus deformity. Diffuse soft tissue edema. IMPRESSION: 1. No acute displaced fractures within either ankle. 2. Symmetrical osteoarthritis of the  bilateral ankles, hind feet, and mid feet. 3. Pes planus deformity bilaterally. 4. Extensive bilateral soft tissue edema. Electronically Signed   By: Bobbye Burrow M.D.   On: 05/22/2024 08:28   DG Ankle Complete Left Result Date: 05/22/2024 CLINICAL DATA:  Tripped and fell, bilateral lower extremity swelling and pain EXAM: RIGHT ANKLE - COMPLETE 3+ VIEW; LEFT ANKLE COMPLETE - 3+ VIEW COMPARISON:  None Available. FINDINGS: Left ankle: Frontal, oblique, and lateral views are obtained. There are no acute displaced fractures. Moderate osteoarthritis throughout the ankle, hindfoot, and visualized midfoot. Pes planus deformity. Diffuse subcutaneous edema. Prominent inferior calcaneal spur. Right ankle: Frontal, oblique, and lateral views are obtained. No acute displaced fractures. Moderate to severe osteoarthritis throughout the ankle, hindfoot, and visualized midfoot. Prominent inferior calcaneal spur. Pes planus deformity. Diffuse soft tissue edema. IMPRESSION: 1. No acute displaced fractures within either ankle. 2. Symmetrical osteoarthritis of the bilateral ankles, hind feet, and mid feet. 3. Pes planus deformity bilaterally. 4. Extensive bilateral soft tissue edema. Electronically Signed   By: Bobbye Burrow M.D.   On: 05/22/2024 08:28   DG Knee Complete 4 Views Left Result Date: 05/22/2024 CLINICAL DATA:  Tripped and fell last week, bilateral leg swelling and pain EXAM: LEFT KNEE - COMPLETE 4+ VIEW; RIGHT KNEE - COMPLETE 4+ VIEW COMPARISON:  07/03/2019 FINDINGS: Left knee: Frontal, bilateral oblique, and cross-table lateral views are obtained. No acute displaced fracture, subluxation, or dislocation. There is mild 3 compartmental osteoarthritis greatest in the patellofemoral compartment. Moderate joint effusion. Diffuse subcutaneous edema. Right knee: Frontal, bilateral oblique, and cross-table lateral views are obtained. No acute fracture, subluxation, or dislocation. Mild 3 compartmental osteoarthritis  greatest in the medial and patellofemoral compartments. Moderate joint effusion. Diffuse subcutaneous edema. IMPRESSION: 1. Bilateral 3 compartmental osteoarthritis and moderate knee effusions. 2. No acute displaced fractures. 3. Diffuse subcutaneous edema bilaterally. Electronically Signed   By: Bobbye Burrow M.D.   On: 05/22/2024 08:26   DG Knee Complete 4 Views Right Result Date: 05/22/2024 CLINICAL DATA:  Tripped and fell last week, bilateral leg swelling and pain EXAM: LEFT KNEE - COMPLETE 4+ VIEW; RIGHT KNEE - COMPLETE 4+ VIEW COMPARISON:  07/03/2019 FINDINGS: Left knee: Frontal, bilateral oblique, and cross-table lateral views are obtained. No acute displaced fracture, subluxation, or dislocation. There is mild 3 compartmental osteoarthritis greatest in the patellofemoral compartment. Moderate joint effusion. Diffuse subcutaneous edema. Right knee: Frontal, bilateral oblique, and cross-table lateral views are obtained. No acute fracture, subluxation, or dislocation. Mild 3 compartmental osteoarthritis greatest in the medial and patellofemoral compartments. Moderate joint effusion. Diffuse subcutaneous edema. IMPRESSION: 1. Bilateral 3 compartmental osteoarthritis and moderate knee effusions. 2. No acute displaced fractures. 3. Diffuse subcutaneous edema bilaterally. Electronically Signed   By: Bobbye Burrow M.D.   On: 05/22/2024 08:26   DG Hip Unilat W or Wo Pelvis 2-3 Views Right Result Date: 05/22/2024 CLINICAL DATA:  Marvell Slider, pain EXAM: DG HIP (WITH OR WITHOUT PELVIS) 2-3V RIGHT COMPARISON:  None Available. FINDINGS: Frontal view of the pelvis as well as frontal and frogleg lateral  views of the right hip are obtained. Assessment is slightly limited by patient body habitus. No evidence of acute fracture, subluxation, or dislocation. Mild symmetrical bilateral hip osteoarthritis. Sacroiliac joints are unremarkable. Soft tissues appear normal. IMPRESSION: 1. No acute displaced fracture. 2. Mild  symmetrical bilateral hip osteoarthritis. Electronically Signed   By: Bobbye Burrow M.D.   On: 05/22/2024 08:25        Scheduled Meds:  enoxaparin  (LOVENOX ) injection  0.5 mg/kg Subcutaneous Q24H   hydrochlorothiazide   12.5 mg Oral Daily   lisinopril   20 mg Oral Daily   Continuous Infusions:   ceFAZolin  (ANCEF ) IV Stopped (05/23/24 0120)     LOS: 1 day       Alphonsus Jeans, MD [facility] Pager [phone]  If 7PM-7AM, please contact night-coverage [email or web address] 05/23/2024, 8:46 AM

## 2024-05-24 LAB — BASIC METABOLIC PANEL WITH GFR
Anion gap: 9 (ref 5–15)
BUN: 17 mg/dL (ref 6–20)
CO2: 24 mmol/L (ref 22–32)
Calcium: 9.2 mg/dL (ref 8.9–10.3)
Chloride: 101 mmol/L (ref 98–111)
Creatinine, Ser: 0.72 mg/dL (ref 0.61–1.24)
GFR, Estimated: >60 mL/min (ref >60)
Glucose, Bld: 116 mg/dL — ABNORMAL HIGH (ref 70–99)
Potassium: 3.7 mmol/L (ref 3.5–5.1)
Sodium: 134 mmol/L — ABNORMAL LOW (ref 135–145)

## 2024-05-24 MED ORDER — HYDROCODONE-ACETAMINOPHEN 10-325 MG PO TABS
1.0000 | ORAL_TABLET | ORAL | Status: DC | PRN
  Administered 2024-05-24 – 2024-06-03 (×12): 1 via ORAL
  Filled 2024-05-24 (×12): qty 1

## 2024-05-24 MED ORDER — MORPHINE SULFATE (PF) 2 MG/ML IV SOLN
2.0000 mg | INTRAVENOUS | Status: DC | PRN
  Administered 2024-05-24 – 2024-05-28 (×8): 2 mg via INTRAVENOUS
  Filled 2024-05-24 (×9): qty 1

## 2024-05-24 MED ORDER — FUROSEMIDE 10 MG/ML IJ SOLN
40.0000 mg | Freq: Once | INTRAMUSCULAR | Status: AC
  Administered 2024-05-24: 40 mg via INTRAVENOUS
  Filled 2024-05-24: qty 4

## 2024-05-24 NOTE — TOC Initial Note (Signed)
 Transition of Care [facility]) - Initial/Assessment Note    Patient Details  Name: Charles Park MRN: [id number] Date of Birth: 22-Jul-1966  [facility]) CM/SW Contact:    Alexandra Ice, RN Phone Number: 05/24/2024, 2:34 PM  Clinical Narrative:                 Met with patient at bedside, to discuss discharge plan. Also verified he is uninsured, he stated someone from hospital came to his room and talked to him about it.  TOC explained unable to discharge to skilled nursing facility for additional rehab without insurance and most facilities will not accept medicaid pending if he qualifies. Patient verbalized understanding. TOC will continue to follow.   Patient has home DME: bariatric wheelchair, bariatric rolling walker, crutches.        Patient Goals and CMS Choice            Expected Discharge Plan and Services                                              Prior Living Arrangements/Services                       Activities of Daily Living   ADL Screening (condition at time of admission) Independently performs ADLs?: Yes (appropriate for developmental age) Is the patient deaf or have difficulty hearing?: No Does the patient have difficulty seeing, even when wearing glasses/contacts?: No Does the patient have difficulty concentrating, remembering, or making decisions?: No  Permission Sought/Granted                  Emotional Assessment              Admission diagnosis:  Cellulitis of right lower extremity [L03.115] Weakness of both lower extremities [R29.898] Impaired ambulation [R26.2] Patient Active Problem List   Diagnosis Date Noted   Impaired ambulation 05/22/2024   PCP:  Patient, No Pcp Per Pharmacy:   [facility] [address] [place] - [address] [place] [address] Phone: [phone] Fax: [phone]     Social Drivers of Health (SDOH) Social History: SDOH Screenings    Food Insecurity: Food Insecurity Present (05/22/2024)  Housing: Low Risk  (05/22/2024)  Transportation Needs: No Transportation Needs (05/22/2024)  Utilities: At Risk (05/22/2024)  Tobacco Use: Medium Risk (05/22/2024)   SDOH Interventions:     Readmission Risk Interventions     No data to display

## 2024-05-24 NOTE — Progress Notes (Signed)
 Physical Therapy Treatment Patient Details Name: Charles Park MRN: [id number] DOB: 1966/01/17 Today's Date: 05/24/2024   History of Present Illness Charles Park is a 58 y.o. male with medical history significant of morbid obesity, HTN, presented with worsening of complaints about worsening bilateral leg pain, for knee pain and bilateral feet pain.    PT Comments  Pt was long sitting in bed upon arrival. He is A and O. C/o severe bilateral knee pain/ L posterior > R. Eventually pt agreeable and then remains cooperative. He continues to require +2 assistance to safely perform safe functional mobility. Max assist of 1 with 2nd person for safety to achieve EOB sitting. Was able to maintain seated EOB without physical assistance. Bed height had to be elevated drastically for pt to be able to fully stand to sara stedy with +2 assistance. Pain more limiting than strength. Pt does put forth great effort and was able to tolerate standing in stedy, long enough, for bed linens change. MD made aware  that pain limited session progression drastically. Acute PT will continue to follow and progress per current POC.    If plan is discharge home, recommend the following: Two people to help with walking and/or transfers;Two people to help with bathing/dressing/bathroom;Assist for transportation;Help with stairs or ramp for entrance     Equipment Recommendations  Other (comment) (ongoing assessment)       Precautions / Restrictions Precautions Precautions: Fall Recall of Precautions/Restrictions: Intact Restrictions Weight Bearing Restrictions Per Provider Order: No     Mobility  Bed Mobility Overal bed mobility: Needs Assistance Bed Mobility: Supine to Sit, Sit to Supine  Supine to sit: Max assist, +2 for safety/equipment Sit to supine: Max assist, +2 for physical assistance   Transfers Overall transfer level: Needs assistance Equipment used:  Abraham Hoffmann stedy) Transfers: Sit to/from Stand Sit to  Stand: From elevated surface, +2 safety/equipment, +2 physical assistance, Mod assist, Max assist  General transfer comment: pt did stand 3 x from elevated bed height to stedy with +2 assistance. while pt was standing, 3rd person changed bed linens. Elected not to transfer pt to recliner due to being unable to stand from lower/standard heights. discussed with RN staff need for hoyer lift if planning to tranfer pt OOB to recliner at this time.    Ambulation/Gait  General Gait Details: unable    Balance Overall balance assessment: Needs assistance Sitting-balance support: Feet supported, No upper extremity supported Sitting balance-Leahy Scale: Good     Standing balance support: Bilateral upper extremity supported, During functional activity, Reliant on assistive device for balance Standing balance-Leahy Scale: Zero Standing balance comment: +2 assistance + stedy       Communication Communication Communication: No apparent difficulties  Cognition Arousal: Alert Behavior During Therapy: WFL for tasks assessed/performed   PT - Cognitive impairments: No apparent impairments    PT - Cognition Comments: Pt is A and O does need encouragement to participate due to c/o pain but once agreeable, does cooperate and put for good effort. Following commands: Intact      Cueing Cueing Techniques: Verbal cues         Pertinent Vitals/Pain Pain Assessment Pain Assessment: 0-10 Pain Score: 9  Pain Location: Bilateral knees Pain Descriptors / Indicators: Discomfort, Grimacing Pain Intervention(s): Limited activity within patient's tolerance, Monitored during session, Premedicated before session, Repositioned     PT Goals (current goals can now be found in the care plan section) Acute Rehab PT Goals Patient Stated Goal: to get stronger Progress towards PT  goals: Progressing toward goals    Frequency    Min 2X/week       AM-PAC PT "6 Clicks" Mobility   Outcome Measure  Help  needed turning from your back to your side while in a flat bed without using bedrails?: A Lot Help needed moving from lying on your back to sitting on the side of a flat bed without using bedrails?: A Lot Help needed moving to and from a bed to a chair (including a wheelchair)?: Total Help needed standing up from a chair using your arms (e.g., wheelchair or bedside chair)?: Total Help needed to walk in hospital room?: Total Help needed climbing 3-5 steps with a railing? : Total 6 Click Score: 8    End of Session   Activity Tolerance: Patient tolerated treatment well Patient left: in bed;with call bell/phone within reach;with bed alarm set Nurse Communication: Mobility status PT Visit Diagnosis: Difficulty in walking, not elsewhere classified (R26.2);Muscle weakness (generalized) (M62.81);Pain Pain - part of body: Leg     Time: 1101-1131 PT Time Calculation (min) (ACUTE ONLY): 30 min  Charges:    $Therapeutic Activity: 23-37 mins PT General Charges $$ ACUTE PT VISIT: 1 Visit                     Chester Costa PTA 05/24/24, 1:34 PM

## 2024-05-24 NOTE — Progress Notes (Signed)
 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 US  of b/l LE neg for DVT. Tylenol, oxy prn for pain. PT/OT recs SNF but pt does not have any insurance. Neuro surg recommended conservative management.    Bilateral lower extremity cellulitis: continue on IV ancef x5 days. Improved    Morbid obesity: BMI 43.7. Complicates overall care & prognosis       DVT prophylaxis: lovenox  Code Status: full  Family Communication: discussed pt's care w/ pt's family at bedside and answered their questions  Disposition Plan: possible SNF   Level of care: Med-Surg   Status is: Inpatient Remains inpatient appropriate because: severity of illness    Consultants:    Procedures:   Antimicrobials: ancef   Subjective: Pt c/o pain  Objective: Vitals:   05/23/24 1437 05/23/24 2002 05/24/24 0334 05/24/24 0804  BP: (!) 154/65 (!) 141/77 (!) 145/77 (!) 146/75  Pulse: 96 96 82 90  Resp: 16 15 18 18   Temp: 98.4 F (36.9 C) 98.1 F (36.7 C) 97.6 F (36.4 C) 98.5 F (36.9 C)  TempSrc:  Oral Oral Oral  SpO2: 94% 96% 96% 95%  Weight:      Height:        Intake/Output Summary (Last 24 hours) at 05/24/2024 0836 Last data filed at 05/24/2024 0334 Gross per 24 hour  Intake 800 ml  Output 1300 ml  Net -500 ml   Filed Weights   05/22/24 0727  Weight: (!) 158.8 kg    Examination:  General exam: Appears uncomfortable. Morbid obesity  Respiratory system: clear breath sounds b/l  Cardiovascular system: S1/S2+. No rubs or gallops   Gastrointestinal system: Abd is soft, NT, obese & hypoactive bowel sounds. Central nervous system: alert & oriented. Moves all extremities  Psychiatry: Judgement and insight appears normal. Flat mood and affect     Data  Reviewed: I have personally reviewed following labs and imaging studies  CBC: Recent Labs  Lab 05/22/24 0741  WBC 10.3  NEUTROABS 8.1*  HGB 14.2  HCT 43.1  MCV 94.5  PLT 265   Basic Metabolic Panel: Recent Labs  Lab 05/22/24 0848 05/22/24 1013 05/24/24 0439  NA  --  136 134*  K  --  3.7 3.7  CL  --  103 101  CO2  --  21* 24  GLUCOSE  --  96 116*  BUN  --  33* 17  CREATININE  --  0.89 0.72  CALCIUM  --  9.1 9.2  MG 2.5*  --   --    GFR: Estimated Creatinine Clearance: 164.6 mL/min (by C-G formula based on SCr of 0.72 mg/dL). Liver Function Tests: Recent Labs  Lab 05/22/24 1013  AST 40  ALT 57*  ALKPHOS 90  BILITOT 1.5*  PROT 8.3*  ALBUMIN 3.1*   No results for input(s): "LIPASE", "AMYLASE" in the last 168 hours. No results for input(s): "AMMONIA" in the last 168 hours. Coagulation Profile: No results for input(s): "INR", "PROTIME" in the last 168 hours. Cardiac Enzymes: Recent Labs  Lab 05/22/24 1013  CKTOTAL 87   BNP (last 3 results) No results for input(s): "PROBNP" in the last 8760 hours. HbA1C: No results for input(s): "HGBA1C" in the last 72 hours. CBG: No results for input(s): "GLUCAP" in the last 168  hours. Lipid Profile: No results for input(s): "CHOL", "HDL", "LDLCALC", "TRIG", "CHOLHDL", "LDLDIRECT" in the last 72 hours. Thyroid Function Tests: No results for input(s): "TSH", "T4TOTAL", "FREET4", "T3FREE", "THYROIDAB" in the last 72 hours. Anemia Panel: No results for input(s): "VITAMINB12", "FOLATE", "FERRITIN", "TIBC", "IRON", "RETICCTPCT" in the last 72 hours. Sepsis Labs: No results for input(s): "PROCALCITON", "LATICACIDVEN" in the last 168 hours.  Recent Results (from the past 240 hours)  Group A Strep by PCR     Status: None   Collection Time: 05/22/24  4:24 PM   Specimen: Throat; Sterile Swab  Result Value Ref Range Status   Group A Strep by PCR NOT DETECTED NOT DETECTED Final    Comment: Performed at [facility],  [address], [place], [place] [address]         Radiology Studies: MR Lumbar Spine W Wo Contrast Result Date: 05/22/2024 CLINICAL DATA:  Myelopathy, acute, lumbar spine. Bilateral proximal lower extremity weakness. EXAM: MRI LUMBAR SPINE WITHOUT AND WITH CONTRAST TECHNIQUE: Multiplanar and multiecho pulse sequences of the lumbar spine were obtained without and with intravenous contrast. CONTRAST:  10mL GADAVIST GADOBUTROL 1 MMOL/ML IV SOLN COMPARISON:  None Available. FINDINGS: Segmentation:  Standard. Alignment:  Normal. Vertebrae: No fracture, suspicious marrow lesion, or significant marrow edema. Moderate Modic type 2 endplate changes at L5-S1. Conus medullaris and cauda equina: Conus extends to the L1-2 level. Conus and cauda equina appear normal. Paraspinal and other soft tissues: Unremarkable. Disc levels: T12-L1: Mild facet hypertrophy without disc herniation or stenosis. L1-2: Moderate facet hypertrophy without disc herniation or stenosis. L2-3: Minimal disc bulging and moderate facet and ligamentum flavum hypertrophy without stenosis. L3-4: Disc desiccation and mild disc space narrowing. Disc bulging and severe facet and ligamentum flavum hypertrophy result in mild spinal stenosis and moderate right and moderate to severe left neural foraminal stenosis. L4-5: Disc desiccation and mild disc space narrowing. Disc bulging, a central disc protrusion with annular fissure, and mild-to-moderate facet and ligamentum flavum hypertrophy result in mild spinal stenosis and moderate bilateral neural foraminal stenosis. L5-S1: Disc desiccation and moderate disc space narrowing. Circumferential disc bulging and mild-to-moderate facet hypertrophy result in moderate bilateral neural foraminal stenosis without spinal stenosis. IMPRESSION: 1. 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 2. Moderate to severe neural foraminal stenosis at L3-4 and moderate foraminal  stenosis at L4-5 and L5-S1. Electronically Signed   By: Aundra Lee M.D.   On: 05/22/2024 15:14   US  Venous Img Lower Bilateral Result Date: 05/22/2024 CLINICAL DATA:  Lower extremity swelling for 4 days EXAM: Bilateral Lower Extremity Venous Doppler Ultrasound TECHNIQUE: Gray-scale sonography with compression, as well as color and duplex ultrasound, were performed to evaluate the deep venous system(s) from the level of the common femoral vein through the popliteal and proximal calf veins. COMPARISON:  None available FINDINGS: VENOUS Normal compressibility of the common femoral, superficial femoral, and popliteal veins, as well as the visualized calf veins. Visualized portions of profunda femoral vein and great saphenous vein unremarkable. No filling defects to suggest DVT on grayscale or color Doppler imaging. Doppler waveforms show normal direction of venous flow, normal respiratory plasticity and response to augmentation. OTHER None. Limitations: Limited visualization of the calf veins. IMPRESSION: No lower extremity DVT. Electronically Signed   By: Elester Grim M.D.   On: 05/22/2024 12:24        Scheduled Meds:  enoxaparin (LOVENOX) injection  0.5 mg/kg Subcutaneous Q24H   hydrochlorothiazide  12.5 mg  Oral Daily   lisinopril  20 mg Oral Daily   Continuous Infusions:   ceFAZolin (ANCEF) IV Stopped (05/24/24 0000)     LOS: 2 days       Alphonsus Jeans, MD [facility] Pager [phone]  If 7PM-7AM, please contact night-coverage [email or web address] 05/24/2024, 8:36 AM

## 2024-05-24 NOTE — Progress Notes (Signed)
 Occupational Therapy Treatment Patient Details Name: Charles Park MRN: [id number] DOB: May 07, 1966 Today's Date: 05/24/2024   History of present illness Charles Park is a 58 y.o. male with medical history significant of morbid obesity, HTN, presented with worsening of complaints about worsening bilateral leg pain, for knee pain and bilateral feet pain.   OT comments  Charles Park seen for OT treatment on this date. Upon arrival to room pt supine in bed. Pt deferred mobility due to pain, had just received morphine prior to session; agreeable to completing exercises in bed. Pt educated on general UB/LB exercises and completed exercises as outlined below. Pt making progress toward goals, will continue to follow POC. Discharge recommendation remains appropriate.        If plan is discharge home, recommend the following:  Two people to help with walking and/or transfers;A lot of help with bathing/dressing/bathroom;Assist for transportation;Help with stairs or ramp for entrance;Assistance with cooking/housework   Equipment Recommendations  Other (comment)    Recommendations for Other Services      Precautions / Restrictions Precautions Precautions: Fall Restrictions Weight Bearing Restrictions Per Provider Order: No       Mobility Bed Mobility                    Transfers                         Balance                                           ADL either performed or assessed with clinical judgement   ADL                                         General ADL Comments: Pt declined    Extremity/Trunk Assessment Upper Extremity Assessment Upper Extremity Assessment: Generalized weakness   Lower Extremity Assessment Lower Extremity Assessment: Generalized weakness        Vision       Perception     Praxis     Communication Communication Communication: No apparent difficulties   Cognition Arousal:  Alert Behavior During Therapy: WFL for tasks assessed/performed Cognition: No apparent impairments                               Following commands: Intact        Cueing   Cueing Techniques: Verbal cues, Visual cues  Exercises Exercises: General Upper Extremity, General Lower Extremity General Exercises - Upper Extremity Shoulder Flexion: AROM, Both, 20 reps, Supine, Theraband Shoulder Extension: AROM, Both, 20 reps, Supine, Theraband Shoulder Horizontal ABduction: AROM, Both, 20 reps, Supine, Theraband Shoulder Horizontal ADduction: AROM, Both, 20 reps, Supine, Theraband Elbow Flexion: AROM, Both, 20 reps, Supine, Theraband Elbow Extension: AROM, Both, 20 reps, Supine, Theraband General Exercises - Lower Extremity Quad Sets: AROM, Both, 10 reps, Supine Gluteal Sets: AROM, Both, 10 reps, Supine    Shoulder Instructions       General Comments      Pertinent Vitals/ Pain       Pain Assessment Pain Assessment: 0-10 Pain Score: 6  Pain Location: bilateral knees/hips Pain Descriptors / Indicators: Discomfort Pain Intervention(s): Limited activity within patient's tolerance, Monitored during  session, Premedicated before session  Home Living                                          Prior Functioning/Environment              Frequency  Min 2X/week        Progress Toward Goals  OT Goals(current goals can now be found in the care plan section)  Progress towards OT goals: Progressing toward goals  Acute Rehab OT Goals Patient Stated Goal: have less pain OT Goal Formulation: With patient Time For Goal Achievement: 06/07/24 Potential to Achieve Goals: Good ADL Goals Pt Will Perform Grooming: standing;with max assist Pt Will Perform Lower Body Dressing: sit to/from stand;with max assist Pt Will Transfer to Toilet: bedside commode;stand pivot transfer;with +2 assist Pt Will Perform Toileting - Clothing Manipulation and hygiene: with  max assist;sit to/from stand  Plan      Co-evaluation                 AM-PAC OT "6 Clicks" Daily Activity     Outcome Measure   Help from another person eating meals?: None Help from another person taking care of personal grooming?: A Little Help from another person toileting, which includes using toliet, bedpan, or urinal?: A Lot Help from another person bathing (including washing, rinsing, drying)?: A Lot Help from another person to put on and taking off regular upper body clothing?: A Little Help from another person to put on and taking off regular lower body clothing?: A Lot 6 Click Score: 16    End of Session    OT Visit Diagnosis: Unsteadiness on feet (R26.81);Other abnormalities of gait and mobility (R26.89);Muscle weakness (generalized) (M62.81);Pain   Activity Tolerance Patient limited by pain   Patient Left in bed;with call bell/phone within reach;with bed alarm set   Nurse Communication          Time: 9604-5409 OT Time Calculation (min): 18 min  Charges: OT General Charges $OT Visit: 1 Visit OT Treatments $Therapeutic Exercise: 8-22 mins  Stevenson Elbe, Student OT   [employer] 05/24/2024, 3:48 PM

## 2024-05-25 LAB — BASIC METABOLIC PANEL WITH GFR
Anion gap: 10 (ref 5–15)
BUN: 17 mg/dL (ref 6–20)
CO2: 24 mmol/L (ref 22–32)
Calcium: 8.9 mg/dL (ref 8.9–10.3)
Chloride: 98 mmol/L (ref 98–111)
Creatinine, Ser: 0.77 mg/dL (ref 0.61–1.24)
GFR, Estimated: >60 mL/min (ref >60)
Glucose, Bld: 142 mg/dL — ABNORMAL HIGH (ref 70–99)
Potassium: 3.7 mmol/L (ref 3.5–5.1)
Sodium: 132 mmol/L — ABNORMAL LOW (ref 135–145)

## 2024-05-25 MED ORDER — POLYETHYLENE GLYCOL 3350 17 G PO PACK
17.0000 g | PACK | Freq: Every day | ORAL | Status: DC
  Administered 2024-05-25 – 2024-06-03 (×9): 17 g via ORAL
  Filled 2024-05-25 (×10): qty 1

## 2024-05-25 MED ORDER — DOCUSATE SODIUM 100 MG PO CAPS
200.0000 mg | ORAL_CAPSULE | Freq: Two times a day (BID) | ORAL | Status: DC
  Administered 2024-05-25 – 2024-06-03 (×17): 200 mg via ORAL
  Filled 2024-05-25 (×17): qty 2

## 2024-05-25 MED ORDER — CYCLOBENZAPRINE HCL 5 MG PO TABS
7.5000 mg | ORAL_TABLET | Freq: Three times a day (TID) | ORAL | Status: DC | PRN
  Administered 2024-05-25 – 2024-06-03 (×7): 7.5 mg via ORAL
  Filled 2024-05-25 (×9): qty 1.5

## 2024-05-25 MED ORDER — FUROSEMIDE 10 MG/ML IJ SOLN
40.0000 mg | Freq: Once | INTRAMUSCULAR | Status: AC
  Administered 2024-05-25: 40 mg via INTRAVENOUS
  Filled 2024-05-25: qty 4

## 2024-05-25 NOTE — Plan of Care (Signed)
   Problem: Education: Goal: Knowledge of General Education information will improve Description: Including pain rating scale, medication(s)/side effects and non-pharmacologic comfort measures Outcome: Progressing   Problem: Health Behavior/Discharge Planning: Goal: Ability to manage health-related needs will improve Outcome: Progressing   Problem: Clinical Measurements: Goal: Ability to maintain clinical measurements within normal limits will improve Outcome: Progressing Goal: Will remain free from infection Outcome: Progressing Goal: Diagnostic test results will improve Outcome: Progressing Goal: Respiratory complications will improve Outcome: Progressing Goal: Cardiovascular complication will be avoided Outcome: Progressing   Problem: Activity: Goal: Risk for activity intolerance will decrease Outcome: Progressing   Problem: Nutrition: Goal: Adequate nutrition will be maintained Outcome: Progressing   Problem: Coping: Goal: Level of anxiety will decrease Outcome: Progressing   Problem: Elimination: Goal: Will not experience complications related to bowel motility Outcome: Progressing Goal: Will not experience complications related to urinary retention Outcome: Progressing   Problem: Pain Managment: Goal: General experience of comfort will improve and/or be controlled Outcome: Progressing   Problem: Safety: Goal: Ability to remain free from injury will improve Outcome: Progressing   Problem: Skin Integrity: Goal: Risk for impaired skin integrity will decrease Outcome: Progressing   Problem: Clinical Measurements: Goal: Ability to avoid or minimize complications of infection will improve Outcome: Progressing   Problem: Skin Integrity: Goal: Skin integrity will improve Outcome: Progressing   Problem: Clinical Measurements: Goal: Ability to avoid or minimize complications of infection will improve Outcome: Progressing   Problem: Skin Integrity: Goal: Skin  integrity will improve Outcome: Progressing

## 2024-05-25 NOTE — Progress Notes (Signed)
 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 US  of b/l LE neg for DVT. Norco, morphine  prn. Flexeril prn. PT/OT recs SNF but pt does not have any insurance. Neuro surg recommended conservative management.    Bilateral lower extremity cellulitis: w/ edema. Continue on IV ancef  x 5 days. S/p IV lasix  x 2. Improved   Morbid obesity: BMI 43.7. Complicates overall care & prognosis   Hyponatremia: labile. Will continue to monitor     DVT prophylaxis: lovenox   Code Status: full  Family Communication: discussed pt's care w/ pt's family at bedside and answered their questions  Disposition Plan: possible SNF   Level of care: Med-Surg   Status is: Inpatient Remains inpatient appropriate because: severity of illness    Consultants:    Procedures:   Antimicrobials: ancef    Subjective: Pt c/o muscle spasms.   Objective: Vitals:   05/24/24 1631 05/24/24 1940 05/25/24 0419 05/25/24 0737  BP: 114/71 (!) 140/70 (!) 148/86 (!) 144/74  Pulse: 79 100 91 90  Resp: 18 18 14 15   Temp: 99.8 F (37.7 C) (!) 100.9 F (38.3 C) 98.7 F (37.1 C) 98.7 F (37.1 C)  TempSrc: Oral Oral Oral   SpO2: 95% 96% 95% 95%  Weight:      Height:        Intake/Output Summary (Last 24 hours) at 05/25/2024 0834 Last data filed at 05/25/2024 9562 Gross per 24 hour  Intake 200 ml  Output 3050 ml  Net -2850 ml   Filed Weights   05/22/24 0727  Weight: (!) 158.8 kg    Examination:  General exam: Appears comfortable. Morbidly obese Respiratory system: clear breath sounds b/l  Cardiovascular system: S1 & S2+. No rubs or clicks  Gastrointestinal system: abd is soft, NT, obese & hypoactive bowel sounds  Central nervous system:alert & oriented. Moves all extremities   Psychiatry: Judgement and insight appears normal. Flat mood and affect    Data Reviewed: I have personally reviewed following labs and imaging studies  CBC: Recent Labs  Lab 05/22/24 0741  WBC 10.3  NEUTROABS 8.1*  HGB 14.2  HCT 43.1  MCV 94.5  PLT 265   Basic Metabolic Panel: Recent Labs  Lab 05/22/24 0848 05/22/24 1013 05/24/24 0439 05/25/24 0527  NA  --  136 134* 132*  K  --  3.7 3.7 3.7  CL  --  103 101 98  CO2  --  21* 24 24  GLUCOSE  --  96 116* 142*  BUN  --  33* 17 17  CREATININE  --  0.89 0.72 0.77  CALCIUM  --  9.1 9.2 8.9  MG 2.5*  --   --   --    GFR: Estimated Creatinine Clearance: 164.6 mL/min (by C-G formula based on SCr of 0.77 mg/dL). Liver Function Tests: Recent Labs  Lab 05/22/24 1013  AST 40  ALT 57*  ALKPHOS 90  BILITOT 1.5*  PROT 8.3*  ALBUMIN 3.1*   No results for input(s): "LIPASE", "AMYLASE" in the last 168 hours. No results for input(s): "AMMONIA" in the last 168 hours. Coagulation Profile: No results for input(s): "INR", "PROTIME" in the last 168 hours. Cardiac Enzymes: Recent Labs  Lab 05/22/24 1013  CKTOTAL 87   BNP (last 3 results) No results for input(s): "  PROBNP" in the last 8760 hours. HbA1C: No results for input(s): "HGBA1C" in the last 72 hours. CBG: No results for input(s): "GLUCAP" in the last 168 hours. Lipid Profile: No results for input(s): "CHOL", "HDL", "LDLCALC", "TRIG", "CHOLHDL", "LDLDIRECT" in the last 72 hours. Thyroid Function Tests: No results for input(s): "TSH", "T4TOTAL", "FREET4", "T3FREE", "THYROIDAB" in the last 72 hours. Anemia Panel: No results for input(s): "VITAMINB12", "FOLATE", "FERRITIN", "TIBC", "IRON", "RETICCTPCT" in the last 72 hours. Sepsis Labs: No results for input(s): "PROCALCITON", "LATICACIDVEN" in the last 168 hours.  Recent Results (from the past 240 hours)  Group A Strep by PCR     Status: None   Collection Time: 05/22/24  4:24 PM   Specimen: Throat; Sterile Swab   Result Value Ref Range Status   Group A Strep by PCR NOT DETECTED NOT DETECTED Final    Comment: Performed at [facility], [address] [place], [place] [address]         Radiology Studies: No results found.       Scheduled Meds:  enoxaparin  (LOVENOX ) injection  0.5 mg/kg Subcutaneous Q24H   hydrochlorothiazide   12.5 mg Oral Daily   lisinopril   20 mg Oral Daily   Continuous Infusions:   ceFAZolin  (ANCEF ) IV Stopped (05/24/24 2350)     LOS: 3 days       Alphonsus Jeans, MD [facility] Pager [phone]  If 7PM-7AM, please contact night-coverage [email or web address] 05/25/2024, 8:34 AM

## 2024-05-25 NOTE — Plan of Care (Signed)

## 2024-05-26 LAB — BASIC METABOLIC PANEL WITH GFR
Anion gap: 10 (ref 5–15)
BUN: 23 mg/dL — ABNORMAL HIGH (ref 6–20)
CO2: 25 mmol/L (ref 22–32)
Calcium: 9.1 mg/dL (ref 8.9–10.3)
Chloride: 95 mmol/L — ABNORMAL LOW (ref 98–111)
Creatinine, Ser: 0.8 mg/dL (ref 0.61–1.24)
GFR, Estimated: >60 mL/min (ref >60)
Glucose, Bld: 171 mg/dL — ABNORMAL HIGH (ref 70–99)
Potassium: 3.7 mmol/L (ref 3.5–5.1)
Sodium: 130 mmol/L — ABNORMAL LOW (ref 135–145)

## 2024-05-26 MED ORDER — DICLOFENAC SODIUM 1 % EX GEL
2.0000 g | Freq: Four times a day (QID) | CUTANEOUS | Status: DC
  Administered 2024-05-26 – 2024-06-03 (×26): 2 g via TOPICAL
  Filled 2024-05-26: qty 100

## 2024-05-26 MED ORDER — BISACODYL 5 MG PO TBEC
10.0000 mg | DELAYED_RELEASE_TABLET | Freq: Once | ORAL | Status: AC
  Administered 2024-05-26: 10 mg via ORAL
  Filled 2024-05-26: qty 2

## 2024-05-26 NOTE — Plan of Care (Signed)

## 2024-05-26 NOTE — Progress Notes (Signed)
 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 US  of b/l LE neg for DVT. Norco, morphine  prn for pain. Flexeril prn. PT/OT recs SNF but pt does not have any insurance. Neuro surg recommended conservative management.    Bilateral lower extremity cellulitis: w/ edema. Continue on IV ancef  x 5 days. S/p IV lasix  x2. Improved    Morbid obesity: BMI 43.7. Complicates overall care & prognosis   Hyponatremia: holding lasix . Will continue to monitor   Constipation: continue on colace, miralax. Dulcolax x 1  DVT prophylaxis: lovenox   Code Status: full  Family Communication: Disposition Plan: possible SNF   Level of care: Med-Surg   Status is: Inpatient Remains inpatient appropriate because: severity of illness    Consultants:    Procedures:   Antimicrobials: ancef    Subjective: Pt c/o constipation  Objective: Vitals:   05/25/24 0737 05/25/24 1554 05/25/24 1953 05/26/24 0436  BP: (!) 144/74 102/76 132/75 137/68  Pulse: 90 (!) 102 98 91  Resp: 15 17 18 17   Temp: 98.7 F (37.1 C) 99.8 F (37.7 C) 99.2 F (37.3 C) 98.2 F (36.8 C)  TempSrc:  Oral Oral Oral  SpO2: 95% 93% 95% 95%  Weight:      Height:        Intake/Output Summary (Last 24 hours) at 05/26/2024 0815 Last data filed at 05/26/2024 0439 Gross per 24 hour  Intake 480 ml  Output 800 ml  Net -320 ml   Filed Weights   05/22/24 0727  Weight: (!) 158.8 kg    Examination:  General exam: Appears calm & comfortable. Morbidly obese Respiratory system: decreased breath sounds b/l otherwise clear  Cardiovascular system: S1/S2+. No rubs or clicks  Gastrointestinal system: abd is soft, NT, obese & hypoactive bowel sounds  Central nervous system: alert & oriented. Moves all  extremities  Psychiatry: judgement and insight appears at baseline. Flat mood and affect    Data Reviewed: I have personally reviewed following labs and imaging studies  CBC: Recent Labs  Lab 05/22/24 0741  WBC 10.3  NEUTROABS 8.1*  HGB 14.2  HCT 43.1  MCV 94.5  PLT 265   Basic Metabolic Panel: Recent Labs  Lab 05/22/24 0848 05/22/24 1013 05/24/24 0439 05/25/24 0527 05/26/24 0529  NA  --  136 134* 132* 130*  K  --  3.7 3.7 3.7 3.7  CL  --  103 101 98 95*  CO2  --  21* 24 24 25   GLUCOSE  --  96 116* 142* 171*  BUN  --  33* 17 17 23*  CREATININE  --  0.89 0.72 0.77 0.80  CALCIUM  --  9.1 9.2 8.9 9.1  MG 2.5*  --   --   --   --    GFR: Estimated Creatinine Clearance: 164.6 mL/min (by C-G formula based on SCr of 0.8 mg/dL). Liver Function Tests: Recent Labs  Lab 05/22/24 1013  AST 40  ALT 57*  ALKPHOS 90  BILITOT 1.5*  PROT 8.3*  ALBUMIN 3.1*   No results for input(s): "LIPASE", "AMYLASE" in the last 168 hours. No results for input(s): "AMMONIA" in the last 168 hours. Coagulation Profile: No results for input(s): "INR", "PROTIME" in the last 168 hours. Cardiac Enzymes: Recent Labs  Lab 05/22/24 1013  CKTOTAL 87  BNP (last 3 results) No results for input(s): "PROBNP" in the last 8760 hours. HbA1C: No results for input(s): "HGBA1C" in the last 72 hours. CBG: No results for input(s): "GLUCAP" in the last 168 hours. Lipid Profile: No results for input(s): "CHOL", "HDL", "LDLCALC", "TRIG", "CHOLHDL", "LDLDIRECT" in the last 72 hours. Thyroid Function Tests: No results for input(s): "TSH", "T4TOTAL", "FREET4", "T3FREE", "THYROIDAB" in the last 72 hours. Anemia Panel: No results for input(s): "VITAMINB12", "FOLATE", "FERRITIN", "TIBC", "IRON", "RETICCTPCT" in the last 72 hours. Sepsis Labs: No results for input(s): "PROCALCITON", "LATICACIDVEN" in the last 168 hours.  Recent Results (from the past 240 hours)  Group A Strep by PCR     Status: None    Collection Time: 05/22/24  4:24 PM   Specimen: Throat; Sterile Swab  Result Value Ref Range Status   Group A Strep by PCR NOT DETECTED NOT DETECTED Final    Comment: Performed at [facility], [address] [place], [place] [address]         Radiology Studies: No results found.       Scheduled Meds:  docusate sodium  200 mg Oral BID   enoxaparin  (LOVENOX ) injection  0.5 mg/kg Subcutaneous Q24H   hydrochlorothiazide   12.5 mg Oral Daily   lisinopril   20 mg Oral Daily   polyethylene glycol  17 g Oral Daily   Continuous Infusions:   ceFAZolin  (ANCEF ) IV 2 g (05/26/24 0048)     LOS: 4 days       Alphonsus Jeans, MD [facility] Pager [phone]  If 7PM-7AM, please contact night-coverage [email or web address] 05/26/2024, 8:15 AM

## 2024-05-26 NOTE — Plan of Care (Signed)
   Problem: Education: Goal: Knowledge of General Education information will improve Description: Including pain rating scale, medication(s)/side effects and non-pharmacologic comfort measures Outcome: Progressing   Problem: Coping: Goal: Level of anxiety will decrease Outcome: Progressing

## 2024-05-26 NOTE — Progress Notes (Signed)
 Physical Therapy Treatment Patient Details Name: Charles Park MRN: [id number] DOB: December 22, 1966 Today's Date: 05/26/2024   History of Present Illness Charles Park is a 58 y.o. male with medical history significant of morbid obesity, HTN, presented with worsening of complaints about worsening bilateral leg pain, for knee pain and bilateral feet pain.    PT Comments  Pt ready for session.  He struggles to EOB today and requires heavy assist of +2 with bed features.  Initially sitting balance needs assist due to significant post lean.  After several minutes he is able to maintain on his own and participate in upper body bathing in sitting.  Assist needed for back.  He is unable to attempt standing today due to LE pain and cramping.  Assisted back to supine with max/dependant +3 and bathing and linen changes completed with nursing staff assist.     If plan is discharge home, recommend the following: Two people to help with walking and/or transfers;Two people to help with bathing/dressing/bathroom;Assist for transportation;Help with stairs or ramp for entrance   Can travel by private vehicle        Equipment Recommendations       Recommendations for Other Services       Precautions / Restrictions Precautions Precautions: Fall Restrictions Weight Bearing Restrictions Per Provider Order: No     Mobility  Bed Mobility Overal bed mobility: Needs Assistance Bed Mobility: Supine to Sit, Sit to Supine     Supine to sit: Max assist, +2 for safety/equipment, Total assist, Used rails, HOB elevated Sit to supine: +2 for physical assistance, +2 for safety/equipment, Total assist   General bed mobility comments: +3 to return to bed Patient Response: Cooperative  Transfers Overall transfer level: Needs assistance                 General transfer comment: unable to stand    Ambulation/Gait                   Stairs             Wheelchair Mobility     Tilt  Bed Tilt Bed Patient Response: Cooperative  Modified Rankin (Stroke Patients Only)       Balance Overall balance assessment: Needs assistance Sitting-balance support: Feet supported, No upper extremity supported Sitting balance-Leahy Scale: Good Sitting balance - Comments: once sitting                                    Communication Communication Communication: No apparent difficulties  Cognition Arousal: Alert Behavior During Therapy: WFL for tasks assessed/performed   PT - Cognitive impairments: No apparent impairments                         Following commands: Intact      Cueing Cueing Techniques: Verbal cues, Visual cues  Exercises Other Exercises Other Exercises: seated AROM    General Comments        Pertinent Vitals/Pain Pain Assessment Pain Assessment: Faces Faces Pain Scale: Hurts whole lot Pain Location: bilateral knees/hips Pain Descriptors / Indicators: Discomfort, Grimacing Pain Intervention(s): Limited activity within patient's tolerance, Monitored during session, Premedicated before session, Repositioned    Home Living                          Prior Function  PT Goals (current goals can now be found in the care plan section) Progress towards PT goals: Progressing toward goals    Frequency    Min 2X/week      PT Plan      Co-evaluation              AM-PAC PT "6 Clicks" Mobility   Outcome Measure  Help needed turning from your back to your side while in a flat bed without using bedrails?: Total Help needed moving from lying on your back to sitting on the side of a flat bed without using bedrails?: Total Help needed moving to and from a bed to a chair (including a wheelchair)?: Total Help needed standing up from a chair using your arms (e.g., wheelchair or bedside chair)?: Total Help needed to walk in hospital room?: Total Help needed climbing 3-5 steps with a railing? : Total 6  Click Score: 6    End of Session   Activity Tolerance: Patient tolerated treatment well Patient left: in bed;with call bell/phone within reach;with bed alarm set;with nursing/sitter in room Nurse Communication: Mobility status PT Visit Diagnosis: Difficulty in walking, not elsewhere classified (R26.2);Muscle weakness (generalized) (M62.81);Pain Pain - part of body: Leg     Time: 1478-2956 PT Time Calculation (min) (ACUTE ONLY): 25 min  Charges:    $Therapeutic Exercise: 8-22 mins $Therapeutic Activity: 8-22 mins PT General Charges $$ ACUTE PT VISIT: 1 Visit                   Charlanne Cong, PTA 05/26/24, 12:28 PM

## 2024-05-27 LAB — BASIC METABOLIC PANEL WITH GFR
Anion gap: 11 (ref 5–15)
BUN: 24 mg/dL — ABNORMAL HIGH (ref 6–20)
CO2: 25 mmol/L (ref 22–32)
Calcium: 9.3 mg/dL (ref 8.9–10.3)
Chloride: 96 mmol/L — ABNORMAL LOW (ref 98–111)
Creatinine, Ser: 0.72 mg/dL (ref 0.61–1.24)
GFR, Estimated: >60 mL/min (ref >60)
Glucose, Bld: 138 mg/dL — ABNORMAL HIGH (ref 70–99)
Potassium: 4 mmol/L (ref 3.5–5.1)
Sodium: 132 mmol/L — ABNORMAL LOW (ref 135–145)

## 2024-05-27 MED ORDER — FUROSEMIDE 10 MG/ML IJ SOLN
40.0000 mg | Freq: Once | INTRAMUSCULAR | Status: AC
  Administered 2024-05-27: 40 mg via INTRAVENOUS
  Filled 2024-05-27: qty 4

## 2024-05-27 MED ORDER — LACTULOSE 10 GM/15ML PO SOLN
30.0000 g | Freq: Two times a day (BID) | ORAL | Status: DC
  Administered 2024-05-27 – 2024-06-03 (×13): 30 g via ORAL
  Filled 2024-05-27 (×14): qty 60

## 2024-05-27 NOTE — Progress Notes (Signed)
 Physical Therapy Treatment Patient Details Name: Charles Park MRN: [id number] DOB: 11/02/1966 Today's Date: 05/27/2024   History of Present Illness Charles Park is a 58 y.o. male with medical history significant of morbid obesity, HTN, presented with worsening of complaints about worsening bilateral leg pain, for knee pain and bilateral feet pain.    PT Comments  Co-tx with OT due to assist levels and staff/pt safety.  He is assisted to EOB with heavy max/dependant +3 assist.  Once sitting he is able to maintain balance with supervision.  Standing x 1 with heavy use of bariatric RW and +2 to stand with an additional assist to manage bed height once up to lower to allow for pt to sit fully back on bed.  He only stands briefly but is able to generally get fully upright.  Returned to supine with +3 dependant and +4 assist to pull up in trendelenburg position.  Remains limited in mobility but puts in max effort.   If plan is discharge home, recommend the following: Two people to help with walking and/or transfers;Two people to help with bathing/dressing/bathroom;Assist for transportation;Help with stairs or ramp for entrance   Can travel by private vehicle        Equipment Recommendations       Recommendations for Other Services       Precautions / Restrictions Precautions Precautions: Fall Restrictions Weight Bearing Restrictions Per Provider Order: No     Mobility  Bed Mobility Overal bed mobility: Needs Assistance Bed Mobility: Supine to Sit, Sit to Supine     Supine to sit: Max assist, +2 for safety/equipment, Total assist, Used rails, HOB elevated Sit to supine: +2 for physical assistance, +2 for safety/equipment, Total assist   General bed mobility comments: +3 to return to bed Patient Response: Cooperative  Transfers Overall transfer level: Needs assistance Equipment used: Rolling walker (2 wheels) Transfers: Sit to/from Stand Sit to Stand: From elevated surface,  +2 safety/equipment, +2 physical assistance, Total assist           General transfer comment: stood x 1 but unable to try second trial.  + 2 to assist and +1 to lower bed height for getting back on bed when sitting.    Ambulation/Gait               General Gait Details: unable   Stairs             Wheelchair Mobility     Tilt Bed Tilt Bed Patient Response: Cooperative  Modified Rankin (Stroke Patients Only)       Balance Overall balance assessment: Needs assistance Sitting-balance support: Feet supported, No upper extremity supported Sitting balance-Leahy Scale: Good Sitting balance - Comments: once sitting   Standing balance support: Bilateral upper extremity supported, During functional activity, Reliant on assistive device for balance Standing balance-Leahy Scale: Zero Standing balance comment: +2 with heavy reliance on walker and staff                            Communication Communication Communication: No apparent difficulties  Cognition Arousal: Alert Behavior During Therapy: WFL for tasks assessed/performed   PT - Cognitive impairments: No apparent impairments                         Following commands: Intact      Cueing Cueing Techniques: Verbal cues, Visual cues  Exercises      General Comments  Pertinent Vitals/Pain Pain Assessment Pain Assessment: Faces Faces Pain Scale: Hurts whole lot Pain Location: bilateral knees/hips Pain Descriptors / Indicators: Discomfort, Grimacing Pain Intervention(s): Limited activity within patient's tolerance, Monitored during session, Repositioned    Home Living                          Prior Function            PT Goals (current goals can now be found in the care plan section) Progress towards PT goals: Progressing toward goals    Frequency    Min 2X/week      PT Plan      Co-evaluation PT/OT/SLP Co-Evaluation/Treatment: Yes Reason for  Co-Treatment: For patient/therapist safety PT goals addressed during session: Mobility/safety with mobility OT goals addressed during session: ADL's and self-care      AM-PAC PT "6 Clicks" Mobility   Outcome Measure  Help needed turning from your back to your side while in a flat bed without using bedrails?: Total Help needed moving from lying on your back to sitting on the side of a flat bed without using bedrails?: Total Help needed moving to and from a bed to a chair (including a wheelchair)?: Total Help needed standing up from a chair using your arms (e.g., wheelchair or bedside chair)?: Total Help needed to walk in hospital room?: Total Help needed climbing 3-5 steps with a railing? : Total 6 Click Score: 6    End of Session Equipment Utilized During Treatment: Gait belt Activity Tolerance: Patient tolerated treatment well;Patient limited by pain Patient left: in bed;with call bell/phone within reach;with bed alarm set;with nursing/sitter in room Nurse Communication: Mobility status PT Visit Diagnosis: Difficulty in walking, not elsewhere classified (R26.2);Muscle weakness (generalized) (M62.81);Pain Pain - part of body: Leg;Knee     Time: 1420-1443 PT Time Calculation (min) (ACUTE ONLY): 23 min  Charges:    $Therapeutic Activity: 8-22 mins PT General Charges $$ ACUTE PT VISIT: 1 Visit                   Charlanne Cong, PTA 05/27/24, 2:55 PM

## 2024-05-27 NOTE — Progress Notes (Signed)
 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 US  of b/l LE neg for DVT. Norco, morphine  prn. Flexeril prn. PT/OT recs SNF but pt does not have any insurance. Neuro surg recommended conservative management.    Bilateral lower extremity cellulitis: w/ edema. Continue on IV ancef  x 5 days. S/p IV lasix  x 1 again today    Morbid obesity: BMI 43.7. Complicates overall care & prognosis   Hyponatremia: labile. Will continue to monitor   Constipation: continue on colace, miralax, & start lactulose   DVT prophylaxis: lovenox   Code Status: full  Family Communication: Disposition Plan: possible SNF   Level of care: Med-Surg   Status is: Inpatient Remains inpatient appropriate because: medically stable but unable to ambulate. PT/OT recs SNF but pt does not have insurance. Medicaid application is pending    Consultants:    Procedures:   Antimicrobials: ancef    Subjective: Pt c/o generalized weakness  Objective: Vitals:   05/26/24 1640 05/26/24 1923 05/27/24 0417 05/27/24 0742  BP: (!) 156/70 129/75 (!) 128/91 123/77  Pulse: 95 97 90 89  Resp: 18 18 20 16   Temp: 98.8 F (37.1 C) 98.5 F (36.9 C) 98.2 F (36.8 C) 98.6 F (37 C)  TempSrc: Oral Oral Oral Oral  SpO2: 97% 95% 96% 96%  Weight:      Height:        Intake/Output Summary (Last 24 hours) at 05/27/2024 0854 Last data filed at 05/27/2024 0400 Gross per 24 hour  Intake 645.33 ml  Output 1000 ml  Net -354.67 ml   Filed Weights   05/22/24 0727  Weight: (!) 158.8 kg    Examination:  General exam: appears comfortable. Morbidly obese Respiratory system: diminished breath sounds b/l  Cardiovascular system: S1 & S2+. No rubs or clicks  Gastrointestinal system: abd is soft, NT,  obese & hypoactive bowel sounds  Central nervous system: alert & oriented. Moves all extremities  Psychiatry: judgement and insight appears at baseline. Flat mood and affect    Data Reviewed: I have personally reviewed following labs and imaging studies  CBC: Recent Labs  Lab 05/22/24 0741  WBC 10.3  NEUTROABS 8.1*  HGB 14.2  HCT 43.1  MCV 94.5  PLT 265   Basic Metabolic Panel: Recent Labs  Lab 05/22/24 0848 05/22/24 1013 05/24/24 0439 05/25/24 0527 05/26/24 0529 05/27/24 0415  NA  --  136 134* 132* 130* 132*  K  --  3.7 3.7 3.7 3.7 4.0  CL  --  103 101 98 95* 96*  CO2  --  21* 24 24 25 25   GLUCOSE  --  96 116* 142* 171* 138*  BUN  --  33* 17 17 23* 24*  CREATININE  --  0.89 0.72 0.77 0.80 0.72  CALCIUM  --  9.1 9.2 8.9 9.1 9.3  MG 2.5*  --   --   --   --   --    GFR: Estimated Creatinine Clearance: 164.6 mL/min (by C-G formula based on SCr of 0.72 mg/dL). Liver Function Tests: Recent Labs  Lab 05/22/24 1013  AST 40  ALT 57*  ALKPHOS 90  BILITOT 1.5*  PROT 8.3*  ALBUMIN 3.1*   No results for input(s): "LIPASE", "AMYLASE" in the last 168 hours. No results for input(s): "AMMONIA" in  the last 168 hours. Coagulation Profile: No results for input(s): "INR", "PROTIME" in the last 168 hours. Cardiac Enzymes: Recent Labs  Lab 05/22/24 1013  CKTOTAL 87   BNP (last 3 results) No results for input(s): "PROBNP" in the last 8760 hours. HbA1C: No results for input(s): "HGBA1C" in the last 72 hours. CBG: No results for input(s): "GLUCAP" in the last 168 hours. Lipid Profile: No results for input(s): "CHOL", "HDL", "LDLCALC", "TRIG", "CHOLHDL", "LDLDIRECT" in the last 72 hours. Thyroid Function Tests: No results for input(s): "TSH", "T4TOTAL", "FREET4", "T3FREE", "THYROIDAB" in the last 72 hours. Anemia Panel: No results for input(s): "VITAMINB12", "FOLATE", "FERRITIN", "TIBC", "IRON", "RETICCTPCT" in the last 72 hours. Sepsis Labs: No results for input(s):  "PROCALCITON", "LATICACIDVEN" in the last 168 hours.  Recent Results (from the past 240 hours)  Group A Strep by PCR     Status: None   Collection Time: 05/22/24  4:24 PM   Specimen: Throat; Sterile Swab  Result Value Ref Range Status   Group A Strep by PCR NOT DETECTED NOT DETECTED Final    Comment: Performed at [facility], [address] [place], [place] [address]         Radiology Studies: No results found.       Scheduled Meds:  diclofenac Sodium  2 g Topical QID   docusate sodium  200 mg Oral BID   enoxaparin  (LOVENOX ) injection  0.5 mg/kg Subcutaneous Q24H   hydrochlorothiazide   12.5 mg Oral Daily   lactulose  30 g Oral BID   lisinopril   20 mg Oral Daily   polyethylene glycol  17 g Oral Daily   Continuous Infusions:   ceFAZolin  (ANCEF ) IV 2 g (05/27/24 0020)     LOS: 5 days       Alphonsus Jeans, MD [facility] Pager [phone]  If 7PM-7AM, please contact night-coverage [email or web address] 05/27/2024, 8:54 AM

## 2024-05-27 NOTE — NC FL2 (Signed)
 [place]  MEDICAID [id number] LEVEL OF CARE FORM     IDENTIFICATION  Patient Name: Charles Park Birthdate: 10-21-1966 Sex: male Admission Date (Current Location): 05/22/2024  [facility] and [place] Number:  Chiropodist and Address:  [facility], [address], [place], [place] [address]      Provider Number: [id number]  Attending Physician Name and Address:  Alphonsus Jeans, MD  Relative Name and Phone Number:  Niece: Bernida Brink, [phone]    Current Level of Care: Hospital Recommended Level of Care: Skilled Nursing Facility Prior Approval Number:    Date Approved/Denied:   PASRR Number: [id number]  Discharge Plan: SNF    Current Diagnoses: Patient Active Problem List   Diagnosis Date Noted   Impaired ambulation 05/22/2024    Orientation RESPIRATION BLADDER Height & Weight     Self, Time, Situation, Place  Normal Incontinent Weight: (!) 158.8 kg Height:  6\' 3"  (190.5 cm)  BEHAVIORAL SYMPTOMS/MOOD NEUROLOGICAL BOWEL NUTRITION STATUS      Continent Diet (Regular diet, thin liquid)  AMBULATORY STATUS COMMUNICATION OF NEEDS Skin   Extensive Assist Verbally Normal                       Personal Care Assistance Level of Assistance  Bathing, Feeding, Dressing Bathing Assistance: Limited assistance Feeding assistance: Independent Dressing Assistance: Limited assistance     Functional Limitations Info             SPECIAL CARE FACTORS FREQUENCY  PT (By licensed PT), OT (By licensed OT)     PT Frequency: 5 times per week OT Frequency: 5 times per week            Contractures Contractures Info: Not present    Additional Factors Info  Code Status, Allergies Code Status Info: Full Code Allergies Info: NKDA           Current Medications (05/27/2024):  This is the current hospital active medication list Current Facility-Administered Medications  Medication Dose Route Frequency Provider Last Rate Last  Admin   acetaminophen  (TYLENOL ) tablet 650 mg  650 mg Oral Q6H PRN Antoniette Batty T, MD   650 mg at 05/23/24 1452   ceFAZolin  (ANCEF ) IVPB 2g/100 mL premix  2 g Intravenous Q8H Antoniette Batty T, MD 200 mL/hr at 05/27/24 0932 2 g at 05/27/24 0932   cyclobenzaprine (FLEXERIL) tablet 7.5 mg  7.5 mg Oral TID PRN Alphonsus Jeans, MD   7.5 mg at 05/26/24 2149   diclofenac Sodium (VOLTAREN) 1 % topical gel 2 g  2 g Topical QID Mansy, Jan A, MD   2 g at 05/27/24 4010   docusate sodium (COLACE) capsule 200 mg  200 mg Oral BID Alphonsus Jeans, MD   200 mg at 05/27/24 2725   enoxaparin  (LOVENOX ) injection 80 mg  0.5 mg/kg Subcutaneous Q24H Antoniette Batty T, MD   80 mg at 05/26/24 2149   furosemide  (LASIX ) injection 40 mg  40 mg Intravenous Once Alphonsus Jeans, MD       hydrochlorothiazide  (HYDRODIURIL ) tablet 12.5 mg  12.5 mg Oral Daily Antoniette Batty T, MD   12.5 mg at 05/27/24 3664   HYDROcodone -acetaminophen  (NORCO) 10-325 MG per tablet 1 tablet  1 tablet Oral Q4H PRN Alphonsus Jeans, MD   1 tablet at 05/26/24 1224   lactulose (CHRONULAC) 10 GM/15ML solution 30 g  30 g Oral BID Alphonsus Jeans, MD   30 g at 05/27/24 [id number]  lisinopril  (ZESTRIL ) tablet 20 mg  20 mg Oral Daily Antoniette Batty T, MD   20 mg at 05/27/24 0924   morphine  (PF) 2 MG/ML injection 2 mg  2 mg Intravenous Q4H PRN Williams, Jamiese M, MD   2 mg at 05/26/24 2137   ondansetron  (ZOFRAN ) tablet 4 mg  4 mg Oral Q6H PRN [place], MD       Or   ondansetron  (ZOFRAN ) injection 4 mg  4 mg Intravenous Q6H PRN Antoniette Batty T, MD       polyethylene glycol (MIRALAX / GLYCOLAX) packet 17 g  17 g Oral Daily Alphonsus Jeans, MD   17 g at 05/27/24 7425     Discharge Medications: Please see discharge summary for a list of discharge medications.  Relevant Imaging Results:  Relevant Lab Results:   Additional Information SSN: [id number]  Alexandra Ice, RN

## 2024-05-27 NOTE — Plan of Care (Signed)

## 2024-05-27 NOTE — Progress Notes (Signed)
 Occupational Therapy Treatment Patient Details Name: Charles Park MRN: [id number] DOB: [birth year] Today's Date: 05/27/2024   History of present illness Charles Park is a 58 y.o. male with medical history significant of morbid obesity, HTN, presented with worsening of complaints about worsening bilateral leg pain, for knee pain and bilateral feet pain.   OT comments  Upon entering the room, pt supine from bed and agreeable to co-treatment with PT and OT. Pt needing total A of 3 for bed mobility. Once seated on EOB pt able to maintain static sitting balance with close supervision. Pt able to reach across midline to grab washcloth from L with R hand to wash face and B hands with increased time to complete task. Pt stands with total A of 2 from elevated bed with assistance from third person to lower bed for descent back down. Total A of 3 for sit >supine and repositioning. Call bell and all needed items within reach upon exiting the room.       If plan is discharge home, recommend the following:  Two people to help with walking and/or transfers;A lot of help with bathing/dressing/bathroom;Assist for transportation;Help with stairs or ramp for entrance;Assistance with cooking/housework   Equipment Recommendations  Other (comment) (defer to next venue of care)       Precautions / Restrictions Precautions Precautions: Fall Recall of Precautions/Restrictions: Intact       Mobility Bed Mobility Overal bed mobility: Needs Assistance Bed Mobility: Supine to Sit, Sit to Supine     Supine to sit: Max assist, +2 for safety/equipment, Total assist, Used rails, HOB elevated Sit to supine: +2 for physical assistance, +2 for safety/equipment, Total assist   General bed mobility comments: +3 to return to bed    Transfers Overall transfer level: Needs assistance Equipment used: Rolling walker (2 wheels) Transfers: Sit to/from Stand Sit to Stand: From elevated surface, +2 safety/equipment, +2  physical assistance, Total assist           General transfer comment: stood x 1 but unable to try second trial.  + 2 to assist and +1 to lower bed height for getting back on bed when sitting.     Balance Overall balance assessment: Needs assistance Sitting-balance support: Feet supported, No upper extremity supported Sitting balance-Leahy Scale: Good Sitting balance - Comments: once sitting   Standing balance support: Bilateral upper extremity supported, During functional activity, Reliant on assistive device for balance Standing balance-Leahy Scale: Zero Standing balance comment: +2 with heavy reliance on walker and staff                           ADL either performed or assessed with clinical judgement    Extremity/Trunk Assessment Upper Extremity Assessment Upper Extremity Assessment: Generalized weakness   Lower Extremity Assessment Lower Extremity Assessment: Generalized weakness        Vision Patient Visual Report: No change from baseline           Communication Communication Communication: No apparent difficulties   Cognition Arousal: Alert Behavior During Therapy: WFL for tasks assessed/performed Cognition: No apparent impairments             OT - Cognition Comments: A/OX4                 Following commands: Intact        Cueing   Cueing Techniques: Verbal cues, Visual cues             Pertinent Vitals/  Pain       Pain Assessment Pain Assessment: Faces Faces Pain Scale: Hurts whole lot Pain Location: bilateral knees/hips Pain Descriptors / Indicators: Discomfort, Grimacing Pain Intervention(s): Limited activity within patient's tolerance, Monitored during session, Repositioned         Frequency  Min 2X/week        Progress Toward Goals  OT Goals(current goals can now be found in the care plan section)  Progress towards OT goals: Progressing toward goals         Co-evaluation      Reason for  Co-Treatment: For patient/therapist safety PT goals addressed during session: Mobility/safety with mobility OT goals addressed during session: ADL's and self-care      AM-PAC OT "6 Clicks" Daily Activity     Outcome Measure   Help from another person eating meals?: None Help from another person taking care of personal grooming?: A Little Help from another person toileting, which includes using toliet, bedpan, or urinal?: A Lot Help from another person bathing (including washing, rinsing, drying)?: A Lot Help from another person to put on and taking off regular upper body clothing?: A Little Help from another person to put on and taking off regular lower body clothing?: A Lot 6 Click Score: 16    End of Session Equipment Utilized During Treatment: Rolling walker (2 wheels)  OT Visit Diagnosis: Unsteadiness on feet (R26.81);Other abnormalities of gait and mobility (R26.89);Muscle weakness (generalized) (M62.81);Pain Pain - part of body: Knee;Hip;Ankle and joints of foot   Activity Tolerance Patient limited by pain   Patient Left in bed;with call bell/phone within reach;with bed alarm set   Nurse Communication Mobility status        Time: 1308-6578 OT Time Calculation (min): 24 min  Charges: OT General Charges $OT Visit: 1 Visit OT Treatments $Therapeutic Activity: 8-22 mins  George Kinder, MS, OTR/L , CBIS ascom [phone]  05/27/24, 3:45 PM

## 2024-05-27 NOTE — TOC Progression Note (Signed)
 Transition of Care [facility]) - Progression Note    Patient Details  Name: Charles Park MRN: [id number] Date of Birth: [birth year]  Transition of [facility]) CM/SW Contact  Alexandra Ice, RN Phone Number: 05/27/2024, 10:54 AM  Clinical Narrative:     [place] [facility] and bed search completed, pending possible bed offers.       Expected Discharge Plan and Services                                               Social Determinants of Health (SDOH) Interventions SDOH Screenings   Food Insecurity: Food Insecurity Present (05/22/2024)  Housing: Low Risk  (05/22/2024)  Transportation Needs: No Transportation Needs (05/22/2024)  Utilities: At Risk (05/22/2024)  Tobacco Use: Medium Risk (05/22/2024)    Readmission Risk Interventions     No data to display

## 2024-05-27 NOTE — Plan of Care (Signed)
  Problem: Education: Goal: Knowledge of General Education information will improve Description: Including pain rating scale, medication(s)/side effects and non-pharmacologic comfort measures Outcome: Progressing   Problem: Clinical Measurements: Goal: Ability to maintain clinical measurements within normal limits will improve Outcome: Progressing   Problem: Activity: Goal: Risk for activity intolerance will decrease Outcome: Progressing   Problem: Coping: Goal: Level of anxiety will decrease Outcome: Progressing   Problem: Safety: Goal: Ability to remain free from injury will improve Outcome: Progressing   Problem: Skin Integrity: Goal: Risk for impaired skin integrity will decrease Outcome: Progressing   

## 2024-05-28 ENCOUNTER — Inpatient Hospital Stay: Payer: MEDICAID

## 2024-05-28 LAB — BASIC METABOLIC PANEL WITH GFR
Anion gap: 12 (ref 5–15)
BUN: 32 mg/dL — ABNORMAL HIGH (ref 6–20)
CO2: 25 mmol/L (ref 22–32)
Calcium: 9.2 mg/dL (ref 8.9–10.3)
Chloride: 96 mmol/L — ABNORMAL LOW (ref 98–111)
Creatinine, Ser: 0.86 mg/dL (ref 0.61–1.24)
GFR, Estimated: >60 mL/min (ref >60)
Glucose, Bld: 186 mg/dL — ABNORMAL HIGH (ref 70–99)
Potassium: 3.8 mmol/L (ref 3.5–5.1)
Sodium: 133 mmol/L — ABNORMAL LOW (ref 135–145)

## 2024-05-28 MED ORDER — PREDNISONE 50 MG PO TABS
50.0000 mg | ORAL_TABLET | Freq: Every day | ORAL | Status: AC
  Administered 2024-05-28 – 2024-06-01 (×5): 50 mg via ORAL
  Filled 2024-05-28 (×5): qty 1

## 2024-05-28 MED ORDER — FUROSEMIDE 10 MG/ML IJ SOLN
40.0000 mg | Freq: Once | INTRAMUSCULAR | Status: AC
  Administered 2024-05-28: 40 mg via INTRAVENOUS
  Filled 2024-05-28: qty 4

## 2024-05-28 NOTE — Progress Notes (Signed)
 Occupational Therapy Treatment Patient Details Name: Charles Park MRN: [id number] DOB: 1966/10/22 Today's Date: 05/28/2024   History of present illness Charles Park is a 58 y.o. male with medical history significant of morbid obesity, HTN, presented with worsening of complaints about worsening bilateral leg pain, for knee pain and bilateral feet pain.   OT comments    Upon entering the room, pt supine in bed and agreeable to skilled co-treatment with PT and OT. Pt needing total A of 3 for bed mobility to EOB. Pt washing face with set up A and use of R UE with increased effort. Pt stands from elevated surface x 2 reps into STEDY with total A of 3. Very effortful secondary to increased weakness in B UEs to grab and push through bar of stedy for standing. Pt transferred to recliner chair with hoyer sling underneath and plan for RN to assist back to bed via lift. Pt is far from baseline as he was Ind and working full time ~ 2 weeks ago. Call bell and all needed items within reach upon exiting the room.       If plan is discharge home, recommend the following:  Two people to help with walking and/or transfers;A lot of help with bathing/dressing/bathroom;Assist for transportation;Help with stairs or ramp for entrance;Assistance with cooking/housework   Equipment Recommendations  Other (comment) (defer to next venue of care)       Precautions / Restrictions Precautions Precautions: Fall Recall of Precautions/Restrictions: Intact       Mobility Bed Mobility Overal bed mobility: Needs Assistance Bed Mobility: Supine to Sit, Sit to Supine     Supine to sit: +2 for safety/equipment, Total assist, Used rails, HOB elevated, +2 for physical assistance          Transfers Overall transfer level: Needs assistance Equipment used: Rolling walker (2 wheels) Transfers: Sit to/from Stand Sit to Stand: From elevated surface, +2 safety/equipment, +2 physical assistance, Total assist            General transfer comment: Standing x 2 reps within stedy with total A of 3 to come to upright standing     Balance Overall balance assessment: Needs assistance Sitting-balance support: Feet supported, No upper extremity supported Sitting balance-Leahy Scale: Good Sitting balance - Comments: once sitting   Standing balance support: Bilateral upper extremity supported, During functional activity, Reliant on assistive device for balance Standing balance-Leahy Scale: Zero                             ADL either performed or assessed with clinical judgement   ADL       Grooming: Wash/dry face;Wash/dry hands;Sitting;Set up;Supervision/safety                                      Extremity/Trunk Assessment Upper Extremity Assessment Upper Extremity Assessment: Generalized weakness   Lower Extremity Assessment Lower Extremity Assessment: Generalized weakness        Vision Patient Visual Report: No change from baseline           Communication Communication Communication: No apparent difficulties   Cognition Arousal: Alert Behavior During Therapy: WFL for tasks assessed/performed Cognition: No apparent impairments             OT - Cognition Comments: A/OX4  Following commands: Intact        Cueing   Cueing Techniques: Verbal cues, Visual cues             Pertinent Vitals/ Pain       Pain Assessment Pain Assessment: Faces Faces Pain Scale: Hurts whole lot Pain Location: bilateral knees/hips Pain Descriptors / Indicators: Discomfort, Grimacing Pain Intervention(s): Limited activity within patient's tolerance, Monitored during session, Repositioned         Frequency  Min 2X/week        Progress Toward Goals  OT Goals(current goals can now be found in the care plan section)  Progress towards OT goals: Progressing toward goals      AM-PAC OT "6 Clicks" Daily Activity     Outcome Measure   Help  from another person eating meals?: None Help from another person taking care of personal grooming?: A Little Help from another person toileting, which includes using toliet, bedpan, or urinal?: Total Help from another person bathing (including washing, rinsing, drying)?: A Lot Help from another person to put on and taking off regular upper body clothing?: A Lot Help from another person to put on and taking off regular lower body clothing?: Total 6 Click Score: 13    End of Session Equipment Utilized During Treatment: Rolling walker (2 wheels)  OT Visit Diagnosis: Unsteadiness on feet (R26.81);Other abnormalities of gait and mobility (R26.89);Muscle weakness (generalized) (M62.81);Pain Pain - part of body: Knee;Hip;Ankle and joints of foot   Activity Tolerance Patient limited by pain   Patient Left in bed;with call bell/phone within reach;with bed alarm set   Nurse Communication Mobility status        Time: 8295-6213 OT Time Calculation (min): 34 min  Charges: OT General Charges $OT Visit: 1 Visit OT Treatments $Therapeutic Activity: 8-22 mins  George Kinder, MS, OTR/L , CBIS ascom [phone]  05/28/24, 1:20 PM

## 2024-05-28 NOTE — Plan of Care (Signed)
   Problem: Education: Goal: Knowledge of General Education information will improve Description: Including pain rating scale, medication(s)/side effects and non-pharmacologic comfort measures Outcome: Progressing   Problem: Health Behavior/Discharge Planning: Goal: Ability to manage health-related needs will improve Outcome: Progressing   Problem: Clinical Measurements: Goal: Ability to maintain clinical measurements within normal limits will improve Outcome: Progressing Goal: Will remain free from infection Outcome: Progressing Goal: Diagnostic test results will improve Outcome: Progressing Goal: Respiratory complications will improve Outcome: Progressing Goal: Cardiovascular complication will be avoided Outcome: Progressing   Problem: Activity: Goal: Risk for activity intolerance will decrease Outcome: Progressing   Problem: Nutrition: Goal: Adequate nutrition will be maintained Outcome: Progressing   Problem: Coping: Goal: Level of anxiety will decrease Outcome: Progressing   Problem: Elimination: Goal: Will not experience complications related to bowel motility Outcome: Progressing Goal: Will not experience complications related to urinary retention Outcome: Progressing   Problem: Pain Managment: Goal: General experience of comfort will improve and/or be controlled Outcome: Progressing   Problem: Safety: Goal: Ability to remain free from injury will improve Outcome: Progressing   Problem: Skin Integrity: Goal: Risk for impaired skin integrity will decrease Outcome: Progressing   Problem: Clinical Measurements: Goal: Ability to avoid or minimize complications of infection will improve Outcome: Progressing   Problem: Skin Integrity: Goal: Skin integrity will improve Outcome: Progressing   Problem: Clinical Measurements: Goal: Ability to avoid or minimize complications of infection will improve Outcome: Progressing   Problem: Skin Integrity: Goal: Skin  integrity will improve Outcome: Progressing

## 2024-05-28 NOTE — Plan of Care (Signed)
  Problem: Education: Goal: Knowledge of General Education information will improve Description: Including pain rating scale, medication(s)/side effects and non-pharmacologic comfort measures Outcome: Progressing   Problem: Health Behavior/Discharge Planning: Goal: Ability to manage health-related needs will improve Outcome: Progressing   Problem: Clinical Measurements: Goal: Ability to maintain clinical measurements within normal limits will improve Outcome: Progressing Goal: Will remain free from infection Outcome: Progressing Goal: Diagnostic test results will improve Outcome: Progressing Goal: Respiratory complications will improve Outcome: Progressing Goal: Cardiovascular complication will be avoided Outcome: Progressing   Problem: Elimination: Goal: Will not experience complications related to bowel motility Outcome: Progressing Goal: Will not experience complications related to urinary retention Outcome: Progressing   Problem: Coping: Goal: Level of anxiety will decrease Outcome: Progressing   Problem: Pain Managment: Goal: General experience of comfort will improve and/or be controlled Outcome: Progressing

## 2024-05-28 NOTE — Progress Notes (Signed)
 Physical Therapy Treatment Patient Details Name: Charles Park MRN: [id number] DOB: 08/27/1966 Today's Date: 05/28/2024   History of Present Illness Charles Park is a 58 y.o. male with medical history significant of morbid obesity, HTN, presented with worsening of complaints about worsening bilateral leg pain, for knee pain and bilateral feet pain.    PT Comments  PT/OT/rehab tech treatment due to pt/staff safety. Pt c/o increased hand pain but was agreeable to session. Continues to require extensive assistance to achieve EOB sitting. Stood 2 x to sara stedy prior to repositioning in recliner. Pt will require hoyer lift to return to bed due to recliner height/pt weakness. Confirmed pt's PLOF with family post session. Pt remains far from his baseline and will continue to benefit form skilled PT to maximize independnece and safety with all ADLs.      If plan is discharge home, recommend the following: Two people to help with walking and/or transfers;Two people to help with bathing/dressing/bathroom;Assist for transportation;Help with stairs or ramp for entrance     Equipment Recommendations  Other (comment)       Precautions / Restrictions Precautions Precautions: Fall Recall of Precautions/Restrictions: Intact Restrictions Weight Bearing Restrictions Per Provider Order: No     Mobility  Bed Mobility  Sit to supine: +2 for physical assistance, +2 for safety/equipment, Total assist General bed mobility comments: +2-3 to safely achieve EOB sitting. once EOB sittingwith feet support CGA-close supervision only    Transfers Overall transfer level: Needs assistance Equipment used:  (used stedy lift) Transfers: Sit to/from Stand, Bed to chair/wheelchair/BSC Sit to Stand: +2 safety/equipment, +2 physical assistance, Total assist (+3)  General transfer comment: pt did stand 2 x EOB with +3 qassist and use of steady. poor posture first attempt, 2nd attempt slightly improved. pt profusely  sweating throughout session but endorses feeling ok but limited by pain in all extremities Transfer via Lift Equipment:  Abraham Hoffmann stedy)  Ambulation/Gait  General Gait Details: currently unable    Balance     Sitting balance-Leahy Scale: Good  Standing balance comment: +2-3 and use of sara stedy      Communication Communication Communication: No apparent difficulties  Cognition Arousal: Alert Behavior During Therapy: WFL for tasks assessed/performed   PT - Cognitive impairments: No apparent impairments    PT - Cognition Comments: Pt is A and O does need encouragement to participate due to c/o pain but once agreeable, does cooperate and put for good effort. Following commands: Intact      Cueing Cueing Techniques: Verbal cues, Visual cues         Pertinent Vitals/Pain Pain Assessment Pain Assessment: 0-10 Faces Pain Scale: Hurts whole lot Pain Location: bilateral knees/hips/ hands Pain Descriptors / Indicators: Discomfort, Grimacing Pain Intervention(s): Limited activity within patient's tolerance, Monitored during session, Premedicated before session, Repositioned     PT Goals (current goals can now be found in the care plan section) Acute Rehab PT Goals Patient Stated Goal: to get stronger Progress towards PT goals: Not progressing toward goals - comment    Frequency    Min 2X/week       Co-evaluation     PT goals addressed during session: Mobility/safety with mobility;Balance;Proper use of DME;Strengthening/ROM        AM-PAC PT "6 Clicks" Mobility   Outcome Measure  Help needed turning from your back to your side while in a flat bed without using bedrails?: Total Help needed moving from lying on your back to sitting on the side of a flat bed  without using bedrails?: Total Help needed moving to and from a bed to a chair (including a wheelchair)?: Total Help needed standing up from a chair using your arms (e.g., wheelchair or bedside chair)?: Total Help  needed to walk in hospital room?: Total Help needed climbing 3-5 steps with a railing? : Total 6 Click Score: 6    End of Session Equipment Utilized During Treatment: Gait belt Activity Tolerance: Patient limited by pain;Patient limited by fatigue Patient left: in chair;with call bell/phone within reach;with chair alarm set Nurse Communication: Mobility status PT Visit Diagnosis: Difficulty in walking, not elsewhere classified (R26.2);Muscle weakness (generalized) (M62.81);Pain     Time: 1128-1202 PT Time Calculation (min) (ACUTE ONLY): 34 min  Charges:    $Therapeutic Activity: 8-22 mins PT General Charges $$ ACUTE PT VISIT: 1 Visit                     Chester Costa PTA 05/28/24, 2:08 PM

## 2024-05-28 NOTE — Progress Notes (Signed)
 PROGRESS NOTE   HPI was taken from Dr. Jeane Miguel:  Charles Park is a 58 y.o. male with medical history significant of morbid obesity, HTN, presented with worsening of complaints about worsening bilateral leg pain, for knee pain and bilateral feet pain.   Symptoms started 5 days ago, patient started to have bilateral lower extremity pains distributed on bilateral hips bilateral knees and bilateral feet, had to use crutches to ambulate for 2 days.  3 days ago, symptoms became worse and patient found it was extremely difficult to even standing on his feet with excruciating pain of bilateral hips bilateral knees and bilateral feet, as result he has been staying in the recliner for the last 3 days without being to walk.  He denied any numbness or weakness of bilateral lower extremities.  Family also found patient has had increasing swelling and rash of bilateral lower extremities more on the right side below the knees.  Denies any fever chills no shortness of breath or cough.  Denies any trouble urinating or bowel movement.  No numbness or tingling    ED Course: Blood pressure elevated 160/90, not tachycardia afebrile.  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  Neurosurgeon reviewed lumbar MRI and recommend conservative management.   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 US  of b/l LE neg for DVT. Norco, morphine  prn. Flexeril prn. Still not able to ambulate w/  therapy or independently. PT/OT recs SNF but pt does not have any insurance. Medicaid application is pending as per CM. Neuro surg recommended conservative management.    Bilateral lower extremity cellulitis: w/ edema. Completed 5 day course of IV ancef . S/p IV lasix  given again today   B/l hand & wrist pain & weakness: etiology unclear. XR of b/l wrist & neck ordered. DDX, gout vs pseudogout vs viral illness vs carpal tunnel. Started on prednisone x 5 days. If no improvement consider neuro consulted    Morbid obesity: BMI 43.7. Complicates overall care & prognosis   Hyponatremia: labile. Will continue to monitor   Constipation: continue on colace, miralax & lactulose   DVT prophylaxis: lovenox   Code Status: full  Family Communication: Disposition Plan: possible SNF   Level of care: Med-Surg   Status is: Inpatient Remains inpatient appropriate because: medically stable but unable to ambulate. PT/OT recs SNF but pt does not have insurance. Medicaid application is pending    Consultants:    Procedures:   Antimicrobials:    Subjective: Pt c/o generalized weakness still.   Objective: Vitals:   05/27/24 1500 05/27/24 2030 05/28/24 0434 05/28/24 0700  BP: 117/70 (!) 149/85 113/70 (!) 142/74  Pulse: 97 99 84 85  Resp: 16 18 18 18   Temp: 100.1 F (37.8 C) 98.3 F (36.8 C) 98.4 F (36.9 C) 97.8 F (36.6 C)  TempSrc: Oral  Oral Oral  SpO2: 95% 96% 97% 99%  Weight:      Height:        Intake/Output Summary (Last  24 hours) at 05/28/2024 0841 Last data filed at 05/27/2024 2319 Gross per 24 hour  Intake --  Output 2250 ml  Net -2250 ml   Filed Weights   05/22/24 0727  Weight: (!) 158.8 kg    Examination:  General exam: appears calm & comfortable. Morbidly obese Respiratory system: decreased breath sounds b/l  Cardiovascular system: S1/S2+. No rubs or clicks  Gastrointestinal system: abd is soft, NT, obese & hypoactive bowel sounds  Central nervous system: Alert &  awake. Moves all extremities  Psychiatry: judgement and insight appears at baseline. Flat mood and affect     Data Reviewed: I have personally reviewed following labs and imaging studies  CBC: Recent Labs  Lab 05/22/24 0741  WBC 10.3  NEUTROABS 8.1*  HGB 14.2  HCT 43.1  MCV 94.5  PLT 265   Basic Metabolic Panel: Recent Labs  Lab 05/22/24 0848 05/22/24 1013 05/24/24 0439 05/25/24 0527 05/26/24 0529 05/27/24 0415 05/28/24 0309  NA  --    < > 134* 132* 130* 132* 133*  K  --    < > 3.7 3.7 3.7 4.0 3.8  CL  --    < > 101 98 95* 96* 96*  CO2  --    < > 24 24 25 25 25   GLUCOSE  --    < > 116* 142* 171* 138* 186*  BUN  --    < > 17 17 23* 24* 32*  CREATININE  --    < > 0.72 0.77 0.80 0.72 0.86  CALCIUM  --    < > 9.2 8.9 9.1 9.3 9.2  MG 2.5*  --   --   --   --   --   --    < > = values in this interval not displayed.   GFR: Estimated Creatinine Clearance: 153.1 mL/min (by C-G formula based on SCr of 0.86 mg/dL). Liver Function Tests: Recent Labs  Lab 05/22/24 1013  AST 40  ALT 57*  ALKPHOS 90  BILITOT 1.5*  PROT 8.3*  ALBUMIN 3.1*   No results for input(s): "LIPASE", "AMYLASE" in the last 168 hours. No results for input(s): "AMMONIA" in the last 168 hours. Coagulation Profile: No results for input(s): "INR", "PROTIME" in the last 168 hours. Cardiac Enzymes: Recent Labs  Lab 05/22/24 1013  CKTOTAL 87   BNP (last 3 results) No results for input(s): "PROBNP" in the last 8760 hours. HbA1C: No results for input(s): "HGBA1C" in the last 72 hours. CBG: No results for input(s): "GLUCAP" in the last 168 hours. Lipid Profile: No results for input(s): "CHOL", "HDL", "LDLCALC", "TRIG", "CHOLHDL", "LDLDIRECT" in the last 72 hours. Thyroid Function Tests: No results for input(s): "TSH", "T4TOTAL", "FREET4", "T3FREE", "THYROIDAB" in the last 72 hours. Anemia Panel: No results for input(s): "VITAMINB12", "FOLATE", "FERRITIN", "TIBC", "IRON", "RETICCTPCT" in the last  72 hours. Sepsis Labs: No results for input(s): "PROCALCITON", "LATICACIDVEN" in the last 168 hours.  Recent Results (from the past 240 hours)  Group A Strep by PCR     Status: None   Collection Time: 05/22/24  4:24 PM   Specimen: Throat; Sterile Swab  Result Value Ref Range Status   Group A Strep by PCR NOT DETECTED NOT DETECTED Final    Comment: Performed at [facility], [address], [place], [place] [address]         Radiology Studies: No results found.       Scheduled Meds:  diclofenac Sodium  2 g Topical QID  docusate sodium  200 mg Oral BID   enoxaparin  (LOVENOX ) injection  0.5 mg/kg Subcutaneous Q24H   hydrochlorothiazide   12.5 mg Oral Daily   lactulose  30 g Oral BID   lisinopril   20 mg Oral Daily   polyethylene glycol  17 g Oral Daily   Continuous Infusions:   ceFAZolin  (ANCEF ) IV 2 g (05/28/24 0820)     LOS: 6 days       Alphonsus Jeans, MD [facility] Pager [phone]  If 7PM-7AM, please contact night-coverage [email or web address] 05/28/2024, 8:41 AM

## 2024-05-28 NOTE — Progress Notes (Signed)
 Inpatient Rehab Admissions Coordinator:   Per therapy recommendations, patient was screened for CIR candidacy by Wandalee Gust, MS, CCC-SLP. At this time, Pt. is dependent for transfers and not at a level to tolerate the intensity of CIR.; however,  Pt. may have potential to progress to becoming a potential CIR candidate, so CIR admissions team will follow and monitor for progress and participation with therapies and place consult order if Pt. appears to be an appropriate candidate. Please contact me with any questions.   Wandalee Gust, MS, CCC-SLP Rehab Admissions Coordinator  [phone] (celll) [phone] (office)

## 2024-05-29 ENCOUNTER — Inpatient Hospital Stay: Payer: MEDICAID

## 2024-05-29 LAB — CBC
HCT: 38.8 % — ABNORMAL LOW (ref 39.0–52.0)
Hemoglobin: 12.7 g/dL — ABNORMAL LOW (ref 13.0–17.0)
MCH: 30.7 pg (ref 26.0–34.0)
MCHC: 32.7 g/dL (ref 30.0–36.0)
MCV: 93.7 fL (ref 80.0–100.0)
Platelets: 454 10*3/uL — ABNORMAL HIGH (ref 150–400)
RBC: 4.14 MIL/uL — ABNORMAL LOW (ref 4.22–5.81)
RDW: 13.3 % (ref 11.5–15.5)
WBC: 12.8 10*3/uL — ABNORMAL HIGH (ref 4.0–10.5)
nRBC: 0 % (ref 0.0–0.2)

## 2024-05-29 LAB — COMPREHENSIVE METABOLIC PANEL WITH GFR
ALT: 42 U/L (ref 0–44)
AST: 43 U/L — ABNORMAL HIGH (ref 15–41)
Albumin: 2.2 g/dL — ABNORMAL LOW (ref 3.5–5.0)
Alkaline Phosphatase: 158 U/L — ABNORMAL HIGH (ref 38–126)
Anion gap: 10 (ref 5–15)
BUN: 36 mg/dL — ABNORMAL HIGH (ref 6–20)
CO2: 25 mmol/L (ref 22–32)
Calcium: 9.4 mg/dL (ref 8.9–10.3)
Chloride: 96 mmol/L — ABNORMAL LOW (ref 98–111)
Creatinine, Ser: 0.79 mg/dL (ref 0.61–1.24)
GFR, Estimated: >60 mL/min (ref >60)
Glucose, Bld: 259 mg/dL — ABNORMAL HIGH (ref 70–99)
Potassium: 4.3 mmol/L (ref 3.5–5.1)
Sodium: 131 mmol/L — ABNORMAL LOW (ref 135–145)
Total Bilirubin: 0.9 mg/dL (ref 0.0–1.2)
Total Protein: 7.6 g/dL (ref 6.5–8.1)

## 2024-05-29 LAB — HEMOGLOBIN A1C
Hgb A1c MFr Bld: 5.8 % — ABNORMAL HIGH (ref 4.8–5.6)
Mean Plasma Glucose: 119.76 mg/dL

## 2024-05-29 NOTE — TOC Progression Note (Signed)
 Transition of Care [facility]) - Progression Note    Patient Details  Name: Charles Park MRN: [id number] Date of Birth: Aug 17, 1966  [facility] of [facility] CM/SW Contact  Alexandra Ice, RN Phone Number: 05/29/2024, 3:00 PM  Clinical Narrative:    Still pending medical work-up. He is now Medicaid pending. No accepting facility as he does not have a payor source. TOC will continue to follow.         Expected Discharge Plan and Services                                               Social Determinants of Health (SDOH) Interventions SDOH Screenings   Food Insecurity: Food Insecurity Present (05/22/2024)  Housing: Low Risk  (05/22/2024)  Transportation Needs: No Transportation Needs (05/22/2024)  Utilities: At Risk (05/22/2024)  Tobacco Use: Medium Risk (05/22/2024)    Readmission Risk Interventions     No data to display

## 2024-05-29 NOTE — Progress Notes (Signed)
 Physical Therapy Treatment Patient Details Name: Charles Park MRN: [id number] DOB: 1966-10-22 Today's Date: 05/29/2024   History of Present Illness Charles Park is a 58 y.o. male with medical history significant of morbid obesity, HTN, presented with worsening of complaints about worsening bilateral leg pain, for knee pain and bilateral feet pain.    PT Comments  Pt was supine in bed with HOB elevated about 10 degrees.  He endorses feeling a little better today with less overall pain. Continues to have severe weakness in all extremities. Chartered loss adjuster confirmed with family members that he was independent prior to admission and up until ~ 2 weeks ago was working. Most of this session spent on strengthening and ROM exercises. Overall improved BUE strength and ROM but very limited in BLEs. He does not tolerate AAROM/PROM very well due to pain. Yellow thera-band issued for UE strengthening. MRI + full neurosurgical consult pending. Will continue to follow and progress as able per pt tolerance. Pt remains motivated, pleasant, and does fully participate in all desired task requested of him.    If plan is discharge home, recommend the following: Two people to help with walking and/or transfers;Two people to help with bathing/dressing/bathroom;Assist for transportation;Help with stairs or ramp for entrance     Equipment Recommendations  Other (comment) (Defer to next level of care.)       Precautions / Restrictions Precautions Precautions: Fall Recall of Precautions/Restrictions: Intact Restrictions Weight Bearing Restrictions Per Provider Order: No     Mobility  Bed Mobility  General bed mobility comments: Elected to focus on bed level ther ex to progress strength while improving ROM in all extremities       Communication Communication Communication: No apparent difficulties  Cognition Arousal: Alert Behavior During Therapy: WFL for tasks assessed/performed   PT - Cognitive impairments: No  apparent impairments  PT - Cognition Comments: Pt remains A and O x 4 Following commands: Intact      Cueing Cueing Techniques: Verbal cues, Visual cues     General Comments General comments (skin integrity, edema, etc.): Issued bed level HEP handout and then performed AAROM/PROM to BLES. pt remains severely weak and only able to tolerate minimal PROM due to pain. Pt does continue to be unable to tolerate full ROM      Pertinent Vitals/Pain Pain Assessment Pain Assessment: 0-10 Pain Score: 4  Pain Location: bilateral knees/hips/ hands Pain Descriptors / Indicators: Discomfort, Grimacing Pain Intervention(s): Limited activity within patient's tolerance, Monitored during session, Premedicated before session, Repositioned     PT Goals (current goals can now be found in the care plan section) Acute Rehab PT Goals Patient Stated Goal: to get stronger Progress towards PT goals: Progressing toward goals    Frequency    Min 2X/week           Co-evaluation     PT goals addressed during session: Strengthening/ROM        AM-PAC PT "6 Clicks" Mobility   Outcome Measure  Help needed turning from your back to your side while in a flat bed without using bedrails?: Total Help needed moving from lying on your back to sitting on the side of a flat bed without using bedrails?: Total Help needed moving to and from a bed to a chair (including a wheelchair)?: Total Help needed standing up from a chair using your arms (e.g., wheelchair or bedside chair)?: Total Help needed to walk in hospital room?: Total Help needed climbing 3-5 steps with a railing? : Total 6 Click  Score: 6    End of Session   Activity Tolerance: Patient tolerated treatment well;Patient limited by pain Patient left: in bed;with call bell/phone within reach;with bed alarm set Nurse Communication: Mobility status PT Visit Diagnosis: Difficulty in walking, not elsewhere classified (R26.2);Muscle weakness  (generalized) (M62.81);Pain     Time: 4259-5638 PT Time Calculation (min) (ACUTE ONLY): 16 min  Charges:    $Therapeutic Exercise: 8-22 mins PT General Charges $$ ACUTE PT VISIT: 1 Visit                    Chester Costa PTA 05/29/24, 4:40 PM

## 2024-05-29 NOTE — Plan of Care (Signed)
  Problem: Education: Goal: Knowledge of General Education information will improve Description: Including pain rating scale, medication(s)/side effects and non-pharmacologic comfort measures Outcome: Progressing   Problem: Health Behavior/Discharge Planning: Goal: Ability to manage health-related needs will improve Outcome: Progressing   Problem: Clinical Measurements: Goal: Ability to maintain clinical measurements within normal limits will improve Outcome: Progressing Goal: Will remain free from infection Outcome: Progressing Goal: Diagnostic test results will improve Outcome: Progressing Goal: Respiratory complications will improve Outcome: Progressing Goal: Cardiovascular complication will be avoided Outcome: Progressing   Problem: Coping: Goal: Level of anxiety will decrease Outcome: Progressing   Problem: Nutrition: Goal: Adequate nutrition will be maintained Outcome: Progressing   Problem: Activity: Goal: Risk for activity intolerance will decrease Outcome: Progressing   Problem: Elimination: Goal: Will not experience complications related to bowel motility Outcome: Progressing Goal: Will not experience complications related to urinary retention Outcome: Progressing   Problem: Pain Managment: Goal: General experience of comfort will improve and/or be controlled Outcome: Progressing   Problem: Safety: Goal: Ability to remain free from injury will improve Outcome: Progressing   Problem: Skin Integrity: Goal: Risk for impaired skin integrity will decrease Outcome: Progressing   Problem: Clinical Measurements: Goal: Ability to avoid or minimize complications of infection will improve Outcome: Progressing   Problem: Skin Integrity: Goal: Skin integrity will improve Outcome: Progressing   Problem: Skin Integrity: Goal: Skin integrity will improve Outcome: Progressing

## 2024-05-29 NOTE — Progress Notes (Signed)
 Consultation made for ambulation issues.  On initial film review lumbar spine MRI shows some compression but not significant enough to cause his current presentation.  He does have x-rays of the cervical spine which demonstrate multilevel spondylosis, presentation could be consistent with a cervical myelopathy, recommending a cervical MRI without contrast.  Will plan on performing a full consultation after this has been obtained.

## 2024-05-29 NOTE — Plan of Care (Signed)
   Problem: Education: Goal: Knowledge of General Education information will improve Description: Including pain rating scale, medication(s)/side effects and non-pharmacologic comfort measures Outcome: Progressing   Problem: Health Behavior/Discharge Planning: Goal: Ability to manage health-related needs will improve Outcome: Progressing   Problem: Clinical Measurements: Goal: Ability to maintain clinical measurements within normal limits will improve Outcome: Progressing Goal: Will remain free from infection Outcome: Progressing Goal: Diagnostic test results will improve Outcome: Progressing Goal: Respiratory complications will improve Outcome: Progressing Goal: Cardiovascular complication will be avoided Outcome: Progressing   Problem: Activity: Goal: Risk for activity intolerance will decrease Outcome: Progressing   Problem: Nutrition: Goal: Adequate nutrition will be maintained Outcome: Progressing   Problem: Coping: Goal: Level of anxiety will decrease Outcome: Progressing   Problem: Elimination: Goal: Will not experience complications related to bowel motility Outcome: Progressing Goal: Will not experience complications related to urinary retention Outcome: Progressing   Problem: Pain Managment: Goal: General experience of comfort will improve and/or be controlled Outcome: Progressing   Problem: Safety: Goal: Ability to remain free from injury will improve Outcome: Progressing   Problem: Skin Integrity: Goal: Risk for impaired skin integrity will decrease Outcome: Progressing   Problem: Clinical Measurements: Goal: Ability to avoid or minimize complications of infection will improve Outcome: Progressing   Problem: Skin Integrity: Goal: Skin integrity will improve Outcome: Progressing   Problem: Clinical Measurements: Goal: Ability to avoid or minimize complications of infection will improve Outcome: Progressing   Problem: Skin Integrity: Goal: Skin  integrity will improve Outcome: Progressing

## 2024-05-29 NOTE — Progress Notes (Addendum)
 Progress Note   Patient: Charles Park RUE:[id number] DOB: 27-May-1966 DOA: 05/22/2024     7 DOS: the patient was seen and examined on 05/29/2024   Brief hospital course: Elpidio Thielen is a 58 y.o. male with medical history significant of morbid obesity, HTN, presented with worsening of complaints about worsening bilateral leg pain, for knee pain and bilateral feet pain.   Symptoms started 5 days ago, patient started to have bilateral lower extremity pains distributed on bilateral hips bilateral knees and bilateral feet, had to use crutches to ambulate for 2 days.  3 days ago, symptoms became worse and patient found it was extremely difficult to even standing on his feet with excruciating pain of bilateral hips bilateral knees and bilateral feet, as result he has been staying in the recliner for the last 3 days without being to walk.  He denied any numbness or weakness of bilateral lower extremities.  Family also found patient has had increasing swelling and rash of bilateral lower extremities more on the right side below the knees.  Denies any fever chills no shortness of breath or cough.  Denies any trouble urinating or bowel movement.  No numbness or tingling    ED Course: Blood pressure elevated 160/90, not tachycardia afebrile.  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  Neurosurgeon reviewed lumbar MRI and recommend conservative management.     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  US  of b/l LE neg for DVT.  Continue Norco,  morphine  prn. Flexeril prn.  Still not able to ambulate w/ therapy or independently.  PT/OT recs SNF but pt does not have any insurance. Medicaid application is pending as per CM. Neuro surg recommended conservative management.    Bilateral lower extremity cellulitis: w/ edema.  Completed 5 day course of IV ancef .    B/l hand & wrist pain & weakness:  Etiology unclear. X-ray of the cervical spine shows mild-to-moderate C4-5 and C6-7 degenerative disc and endplate changes. Mild bilateral C4-5 neuroforaminal narrowing. Mild retrolisthesis of C4 on C5 and mild grade 1 anterolisthesis of C5 on C6. DDX, gout vs pseudogout vs viral illness vs carpal tunnel.  Started on prednisone x 5 days. If no improvement consider neuro consulted    Morbid obesity: BMI 43.7.  Complicates overall care & prognosis    Hyponatremia:  Secondary to hydrochlorothiazide  use Monitor closely   Constipation:  Continue on colace, miralax & lactulose    Hyperglycemia Related to systemic steroid therapy No known history of diabetes Obtain hemoglobin A1c levels Check blood sugars with meals     Subjective:  No new complaints   Physical Exam: Vitals:   05/28/24 1605 05/28/24 1956 05/29/24 0239 05/29/24 0800  BP: 93/63 (!) 111/52 137/77 131/77  Pulse: 82 95 82 78  Resp: 18 17 17 16   Temp: 97.9 F (36.6 C) 99.6 F (37.6 C) 98.2 F (36.8 C) 98.6 F (37 C)  TempSrc: Oral Oral  Oral  SpO2: 99% 96% 98% 95%  Weight:      Height:  General exam: appears calm & comfortable. Morbidly obese Respiratory system: decreased breath sounds b/l  Cardiovascular system: S1/S2+. No rubs or clicks  Gastrointestinal system: abd is soft, NT, obese & hypoactive bowel sounds  Central nervous system: Alert & awake. Moves all extremities  Psychiatry: judgement and insight appears at baseline. Flat mood and affect     Data Reviewed: Labs reviewed.  Sodium 131, glucose 259, white count 12.8 Labs reviewed 9  Family  Communication: None of care discussed with patient in detail.  He verbalizes understanding and agrees with the plan  Disposition: Status is: Inpatient Remains inpatient appropriate because: Awaiting discharge to SNF  Planned Discharge Destination: SNF    Time spent: 38 minutes  Author: Read Camel, MD 05/29/2024 1:24 PM  For on call review [email or web address].

## 2024-05-30 LAB — GLUCOSE, CAPILLARY
Glucose-Capillary: 190 mg/dL — ABNORMAL HIGH (ref 70–99)
Glucose-Capillary: 392 mg/dL — ABNORMAL HIGH (ref 70–99)
Glucose-Capillary: 369 mg/dL — ABNORMAL HIGH (ref 70–99)

## 2024-05-30 MED ORDER — ORAL CARE MOUTH RINSE
15.0000 mL | OROMUCOSAL | Status: DC | PRN
Start: 2024-05-30 — End: 2024-06-03

## 2024-05-30 MED ORDER — INSULIN ASPART 100 UNIT/ML IJ SOLN: 0.0000 [IU] | Freq: Three times a day (TID) | INTRAMUSCULAR | Status: DC

## 2024-05-30 MED ORDER — INSULIN ASPART 100 UNIT/ML IJ SOLN
0.0000 [IU] | Freq: Three times a day (TID) | INTRAMUSCULAR | Status: DC
  Administered 2024-05-30: 15 [IU] via SUBCUTANEOUS
  Administered 2024-05-31: 11 [IU] via SUBCUTANEOUS
  Administered 2024-05-31 (×2): 2 [IU] via SUBCUTANEOUS
  Administered 2024-06-01: 8 [IU] via SUBCUTANEOUS
  Administered 2024-06-01: 5 [IU] via SUBCUTANEOUS
  Administered 2024-06-01: 8 [IU] via SUBCUTANEOUS
  Administered 2024-06-02: 2 [IU] via SUBCUTANEOUS
  Administered 2024-06-02: 5 [IU] via SUBCUTANEOUS
  Administered 2024-06-02: 2 [IU] via SUBCUTANEOUS
  Administered 2024-06-03: 3 [IU] via SUBCUTANEOUS
  Administered 2024-06-03: 2 [IU] via SUBCUTANEOUS
  Filled 2024-05-30 (×12): qty 1

## 2024-05-30 NOTE — Progress Notes (Signed)
 Physical Therapy Treatment Patient Details Name: Charles Park MRN: [id number] DOB: August 24, 1966 Today's Date: 05/30/2024   History of Present Illness Charles Park is a 58 y.o. male with medical history significant of morbid obesity, HTN, presented with worsening of complaints about worsening bilateral leg pain, for knee pain and bilateral feet pain.    PT Comments  Pt was long sitting in bed upon arrival. He remains A and O x4. Agrees to OOB activity with encouragement. Pt demonstrates improved strength and activity tolerance however remains severely limited. +2 assistance for all mobility and transfers. Unable to progress to taking steps but was able to tolerate standing with +2 assist to stedy with improved posture and for longer duration. Pt continues to c/o severe BUE hand/wrist pain + BLE knee pain. Did tolerate increased knee flexion on stedy prior to standing. Much less posterior push in sitting and in standing. Acute PT will continue to follow and progress per current POC. DC recs remain appropriate to maximize independence while decreasing caregiver burden. Pt remains far from his baseline abilities. Pt was in recliner post session with hoyer sling and all needs within reach.    If plan is discharge home, recommend the following: Two people to help with walking and/or transfers;Two people to help with bathing/dressing/bathroom;Assist for transportation;Help with stairs or ramp for entrance     Equipment Recommendations  Other (comment) (DEfer to next level of care)       Precautions / Restrictions Precautions Precautions: Fall Recall of Precautions/Restrictions: Intact Restrictions Weight Bearing Restrictions Per Provider Order: No     Mobility  Bed Mobility Overal bed mobility: Needs Assistance Bed Mobility: Supine to Sit  Supine to sit: Max assist, Total assist, HOB elevated, Used rails  General bed mobility comments: Pt was able to progress BLEs to EOB however still  requires +2 max assist to fully achieve EOB sitting.    Transfers Overall transfer level: Needs assistance Equipment used: Rolling walker (2 wheels) Transfers: Sit to/from Stand Sit to Stand: +2 safety/equipment, +2 physical assistance, From elevated surface, Via lift equipment (stedy)  General transfer comment: Pt was able to stand 2 x from elevated surfaces to stedy. less overall asisstance today to stand with improved standing posture and standing tolerance. Hygiene care (clean up form previous BM) while in stedy prior to getting to recliner. Pt remains severely limited by pain, strength, and activity tolerance    Ambulation/Gait  General Gait Details: currently unable    Balance Overall balance assessment: Needs assistance Sitting-balance support: Feet supported, No upper extremity supported Sitting balance-Leahy Scale: Good     Standing balance support: Bilateral upper extremity supported, During functional activity, Reliant on assistive device for balance Standing balance-Leahy Scale: Poor       Communication Communication Communication: No apparent difficulties  Cognition Arousal: Alert Behavior During Therapy: Flat affect, WFL for tasks assessed/performed   PT - Cognitive impairments: No apparent impairments, No family/caregiver present to determine baseline   PT - Cognition Comments: Pt remains A and O x 4 Following commands: Intact      Cueing Cueing Techniques: Verbal cues, Tactile cues         Pertinent Vitals/Pain Pain Assessment Pain Assessment: 0-10 Pain Score: 7  Pain Location: bilateral knees/hips/ hands Pain Descriptors / Indicators: Discomfort, Grimacing Pain Intervention(s): Limited activity within patient's tolerance, Premedicated before session, Monitored during session, Repositioned     PT Goals (current goals can now be found in the care plan section) Acute Rehab PT Goals Patient Stated Goal:  to get stronger Progress towards PT goals:  Progressing toward goals    Frequency    Min 2X/week       Co-evaluation     PT goals addressed during session: Mobility/safety with mobility;Balance;Proper use of DME;Strengthening/ROM        AM-PAC PT "6 Clicks" Mobility   Outcome Measure  Help needed turning from your back to your side while in a flat bed without using bedrails?: Total Help needed moving from lying on your back to sitting on the side of a flat bed without using bedrails?: Total Help needed moving to and from a bed to a chair (including a wheelchair)?: Total Help needed standing up from a chair using your arms (e.g., wheelchair or bedside chair)?: Total Help needed to walk in hospital room?: Total Help needed climbing 3-5 steps with a railing? : Total 6 Click Score: 6    End of Session   Activity Tolerance: Patient tolerated treatment well;Patient limited by pain;Patient limited by fatigue Patient left: in bed;with call bell/phone within reach;with bed alarm set Nurse Communication: Mobility status PT Visit Diagnosis: Difficulty in walking, not elsewhere classified (R26.2);Muscle weakness (generalized) (M62.81);Pain     Time: 4098-1191 PT Time Calculation (min) (ACUTE ONLY): 27 min  Charges:    $Therapeutic Activity: 23-37 mins PT General Charges $$ ACUTE PT VISIT: 1 Visit                    Chester Costa PTA 05/30/24, 11:40 AM

## 2024-05-30 NOTE — Progress Notes (Signed)
 Occupational Therapy Treatment Patient Details Name: Menno Vanbergen MRN: [id number] DOB: [birth year] Today's Date: 05/30/2024   History of present illness Dayden Viverette is a 58 y.o. male with medical history significant of morbid obesity, HTN, presented with worsening of complaints about worsening bilateral leg pain, for knee pain and bilateral feet pain.   OT comments  Upon entering the room, pt seated in recliner chair with NT present in room. Pt requesting assistance to return to bed to use bathroom. Hoyer lift utilized with +3 assistance to transfer pt to bed. Pt had already been having BM in hoyer sling and needing total A of 2 to roll and clean and place onto bed pan. Pt remains on bed pan to empty with NT as therapist exits the room.       If plan is discharge home, recommend the following:  Two people to help with walking and/or transfers;A lot of help with bathing/dressing/bathroom;Assist for transportation;Help with stairs or ramp for entrance;Assistance with cooking/housework   Equipment Recommendations  Other (comment) (defer to next venue of care)       Precautions / Restrictions Precautions Precautions: Fall Recall of Precautions/Restrictions: Intact       Mobility Bed Mobility Overal bed mobility: Needs Assistance Bed Mobility: Rolling Rolling: Total assist, +2 for physical assistance              Transfers Overall transfer level: Needs assistance   Transfers: Bed to chair/wheelchair/BSC               Transfer via Lift Equipment: Maximove   Balance Overall balance assessment: Needs assistance Sitting-balance support: Feet supported, No upper extremity supported Sitting balance-Leahy Scale: Good                                     ADL either performed or assessed with clinical judgement   ADL Overall ADL's : Needs assistance/impaired                                       General ADL Comments: hoyer sling to  transfer pt to bed with +3. Pt having BM in sling and needing total A of 2-3 for hygiene and to place on bed pan.    Extremity/Trunk Assessment Upper Extremity Assessment Upper Extremity Assessment: Generalized weakness   Lower Extremity Assessment Lower Extremity Assessment: Generalized weakness        Vision Patient Visual Report: No change from baseline           Communication Communication Communication: No apparent difficulties   Cognition Arousal: Alert Behavior During Therapy: Flat affect, WFL for tasks assessed/performed Cognition: No apparent impairments                               Following commands: Intact        Cueing   Cueing Techniques: Verbal cues, Tactile cues             Pertinent Vitals/ Pain       Pain Assessment Pain Assessment: Faces Faces Pain Scale: Hurts whole lot Pain Location: B feet and LEs Pain Descriptors / Indicators: Discomfort, Grimacing, Aching Pain Intervention(s): Limited activity within patient's tolerance, Monitored during session, Premedicated before session, Repositioned         Frequency  Min 2X/week  Progress Toward Goals  OT Goals(current goals can now be found in the care plan section)  Progress towards OT goals: Progressing toward goals         Co-evaluation        PT goals addressed during session: Mobility/safety with mobility;Balance;Proper use of DME;Strengthening/ROM        AM-PAC OT "6 Clicks" Daily Activity     Outcome Measure   Help from another person eating meals?: None Help from another person taking care of personal grooming?: A Little Help from another person toileting, which includes using toliet, bedpan, or urinal?: Total Help from another person bathing (including washing, rinsing, drying)?: A Lot Help from another person to put on and taking off regular upper body clothing?: A Lot Help from another person to put on and taking off regular lower body  clothing?: Total 6 Click Score: 13    End of Session Equipment Utilized During Treatment: Rolling walker (2 wheels)  OT Visit Diagnosis: Unsteadiness on feet (R26.81);Other abnormalities of gait and mobility (R26.89);Muscle weakness (generalized) (M62.81);Pain   Activity Tolerance Patient limited by pain   Patient Left in bed;with call bell/phone within reach;with bed alarm set   Nurse Communication Mobility status        Time: 1350-1410 OT Time Calculation (min): 20 min  Charges: OT General Charges $OT Visit: 1 Visit OT Treatments $Self Care/Home Management : 8-22 mins  George Kinder, MS, OTR/L , CBIS ascom [phone]  05/30/24, 2:50 PM

## 2024-05-30 NOTE — Consult Note (Signed)
 Consulting Department:  Inpatient medicine  Primary Physician:  Patient, No Pcp Per  Chief Complaint: Bilateral upper and lower extremity weakness  History of Present Illness: 05/30/2024 Charles Park is a 58 y.o. male who presents with the chief complaint of bilateral upper and lower extremity weakness.  He was admitted on 5/28 with weakness in his bilateral lower and upper extremities.  He states that he felt more pain in his knees feet and joints.  On approximately 5/23 he felt like he was developing progressive weakness and over the course of 2 to 3 days became unable to ambulate.  He was having some pain.  He also presented with a rash per history. He has some patchy numbness and tingling no issues with bowel or bladder symptoms.   The symptoms are causing a significant impact on the patient's life.   Allergies: Allergies as of 05/22/2024   (No Known Allergies)    Medications:  Current Facility-Administered Medications:    acetaminophen  (TYLENOL ) tablet 650 mg, 650 mg, Oral, Q6H PRN, Zhang, Ping T, MD, 650 mg at 05/23/24 1452   cyclobenzaprine (FLEXERIL) tablet 7.5 mg, 7.5 mg, Oral, TID PRN, Alphonsus Jeans, MD, 7.5 mg at 05/30/24 1224   diclofenac Sodium (VOLTAREN) 1 % topical gel 2 g, 2 g, Topical, QID, Mansy, Jan A, MD, 2 g at 05/30/24 1433   docusate sodium (COLACE) capsule 200 mg, 200 mg, Oral, BID, Alphonsus Jeans, MD, 200 mg at 05/30/24 1610   enoxaparin  (LOVENOX ) injection 80 mg, 0.5 mg/kg, Subcutaneous, Q24H, Antoniette Batty T, MD, 80 mg at 05/29/24 2156   hydrochlorothiazide  (HYDRODIURIL ) tablet 12.5 mg, 12.5 mg, Oral, Daily, Antoniette Batty T, MD, 12.5 mg at 05/30/24 9604   HYDROcodone -acetaminophen  (NORCO) 10-325 MG per tablet 1 tablet, 1 tablet, Oral, Q4H PRN, Alphonsus Jeans, MD, 1 tablet at 05/30/24 0905   lactulose (CHRONULAC) 10 GM/15ML solution 30 g, 30 g, Oral, BID, Alphonsus Jeans, MD, 30 g at 05/30/24 5409   lisinopril  (ZESTRIL ) tablet 20 mg, 20 mg,  Oral, Daily, Antoniette Batty T, MD, 20 mg at 05/30/24 8119   morphine  (PF) 2 MG/ML injection 2 mg, 2 mg, Intravenous, Q4H PRN, Alphonsus Jeans, MD, 2 mg at 05/28/24 1827   ondansetron  (ZOFRAN ) tablet 4 mg, 4 mg, Oral, Q6H PRN **OR** ondansetron  (ZOFRAN ) injection 4 mg, 4 mg, Intravenous, Q6H PRN, Antoniette Batty T, MD   polyethylene glycol (MIRALAX / GLYCOLAX) packet 17 g, 17 g, Oral, Daily, Broadus Canes, Jamiese M, MD, 17 g at 05/30/24 0855   predniSONE (DELTASONE) tablet 50 mg, 50 mg, Oral, Q breakfast, Alphonsus Jeans, MD, 50 mg at 05/30/24 1478   Social History: Social History   Tobacco Use   Smoking status: Former   Smokeless tobacco: Never    Family Medical History: History reviewed. No pertinent family history.  Physical Examination: Vitals:   05/30/24 0852 05/30/24 1500  BP: (!) 146/89 (!) 149/83  Pulse:  90  Resp:  15  Temp:  98.4 F (36.9 C)  SpO2:  100%     General: Patient is well developed, well nourished, calm, collected, and in no apparent distress.  NEUROLOGICAL:  General: In no acute distress.   Awake, alert, oriented to person, place, and time.  Pupils equal round and reactive to light.  Facial tone is symmetric.  Tongue protrusion is midline.  There is no pronator drift.  Patient denies any neck pain.  Has no pain or catches on active or passive range of motion  Strength/Motor: On physical examination today he is seen with severe deltoid weakness approximately 1 out of 5, and 1-2 out of 5 in handgrip and intrinsic strength.  He has maintained at least 4 out of 5 elbow flexion and elbow extension.  On his lower extremity examination he is at least 4 out of 5 in plantarflexion dorsiflexion and EHL bilaterally.  He is 3 out of 5 in the bilateral knee extensors, 1 out of 5 in the bilateral hip flexors.  He does maintain some proprioception on position sense toe testing, however this is not clearly reproducible.  His reflexes are blunted throughout.  No evidence of  clonus.  Imaging: MR CERVICAL SPINE WO CONTRAST Result Date: 05/30/2024 CLINICAL DATA:  Cervical radiculopathy, no red flags. EXAM: MRI CERVICAL SPINE WITHOUT CONTRAST TECHNIQUE: Multiplanar, multisequence MR imaging of the cervical spine was performed. No intravenous contrast was administered. COMPARISON:  None Available. FINDINGS: Alignment: No substantial sagittal subluxation. Vertebrae: No evidence of acute fracture, suspicious bone lesion or discitis/osteomyelitis. Cord: Normal cord signal. Posterior Fossa, vertebral arteries, paraspinal tissues: Visualized vertebral artery flow voids maintained. No evidence of acute abnormality in the partially visualized posterior fossa. Disc levels: Motion limited evaluation.  Within this limitation: C2-C3: Posterior disc osteophyte complex without significant canal or foraminal stenosis. C3-C4: Bilateral facet and uncovertebral hypertrophy and small posterior disc osteophyte complex. No significant canal or foraminal stenosis. C4-C5: Posterior disc osteophyte complex with right greater than left facet and uncovertebral hypertrophy. Resulting moderate to severe right and moderate left foraminal stenosis. Patent canal. C5-C6: Right greater than left facet and uncovertebral hypertrophy with mild left foraminal stenosis. Patent canal and right foramen. C6-C7: Bilateral facet and uncovertebral hypertrophy with mild-to-moderate right foraminal stenosis. Patent canal. C7-T1: Facet arthropathy without significant stenosis. IMPRESSION: 1. At C4-C5, moderate to severe right and moderate left foraminal stenosis. At C6-C7, mild to moderate right foraminal stenosis. 2. No significant canal stenosis. Electronically Signed   By: Stevenson Elbe M.D.   On: 05/30/2024 03:32    Narrative & Impression  CLINICAL DATA:  Myelopathy, acute, lumbar spine. Bilateral proximal lower extremity weakness.   EXAM: MRI LUMBAR SPINE WITHOUT AND WITH CONTRAST   TECHNIQUE: Multiplanar and  multiecho pulse sequences of the lumbar spine were obtained without and with intravenous contrast.   CONTRAST:  10mL GADAVIST  GADOBUTROL  1 MMOL/ML IV SOLN   COMPARISON:  None Available.   FINDINGS: Segmentation:  Standard.   Alignment:  Normal.   Vertebrae: No fracture, suspicious marrow lesion, or significant marrow edema. Moderate Modic type 2 endplate changes at L5-S1.   Conus medullaris and cauda equina: Conus extends to the L1-2 level. Conus and cauda equina appear normal.   Paraspinal and other soft tissues: Unremarkable.   Disc levels:   T12-L1: Mild facet hypertrophy without disc herniation or stenosis.   L1-2: Moderate facet hypertrophy without disc herniation or stenosis.   L2-3: Minimal disc bulging and moderate facet and ligamentum flavum hypertrophy without stenosis.   L3-4: Disc desiccation and mild disc space narrowing. Disc bulging and severe facet and ligamentum flavum hypertrophy result in mild spinal stenosis and moderate right and moderate to severe left neural foraminal stenosis.   L4-5: Disc desiccation and mild disc space narrowing. Disc bulging, a central disc protrusion with annular fissure, and mild-to-moderate facet and ligamentum flavum hypertrophy result in mild spinal stenosis and moderate bilateral neural foraminal stenosis.   L5-S1: Disc desiccation and moderate disc space narrowing. Circumferential disc bulging and mild-to-moderate facet hypertrophy result in moderate bilateral  neural foraminal stenosis without spinal stenosis.   IMPRESSION: 1. 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 2. Moderate to severe neural foraminal stenosis at L3-4 and moderate foraminal stenosis at L4-5 and L5-S1.     Electronically Signed   By: Aundra Lee M.D.   On: 05/22/2024 15:14   I have personally reviewed the images and agree with the above interpretation.  He does appear to have some signal  change in the dorsal columns on multiple slices.  1 example is below.  I will reach out to the primary team to see whether or not we could discuss this further with the radiologist.      Labs:    Latest Ref Rng & Units 05/29/2024    1:57 AM 05/22/2024    7:41 AM 07/03/2019   10:15 PM  CBC  WBC 4.0 - 10.5 K/uL 12.8  10.3  13.7   Hemoglobin 13.0 - 17.0 g/dL 16.1  09.6  04.5   Hematocrit 39.0 - 52.0 % 38.8  43.1  40.6   Platelets 150 - 400 K/uL 454  265  383       Latest Ref Rng & Units 05/29/2024    1:57 AM 05/28/2024    3:09 AM 05/27/2024    4:15 AM  BMP  Glucose 70 - 99 mg/dL 409  811  914   BUN 6 - 20 mg/dL 36  32  24   Creatinine 0.61 - 1.24 mg/dL 7.82  9.56  2.13   Sodium 135 - 145 mmol/L 131  133  132   Potassium 3.5 - 5.1 mmol/L 4.3  3.8  4.0   Chloride 98 - 111 mmol/L 96  96  96   CO2 22 - 32 mmol/L 25  25  25    Calcium 8.9 - 10.3 mg/dL 9.4  9.2  9.3         Assessment and Plan: Mr. Charles Park is a pleasant 58 y.o. male with rapidly progressive loss of neurologic function including weakness in his arms and legs.  He went from ambulatory to completely and ambulatory over the course of 3 days approximately 2 weeks ago.  He did have some pain at the beginning but states that he is not having any back pain or neck pain at this time.  On physical examination he has severe weakness in his shoulders and hands as well as his hip flexors, but maintained strength in his elbow flexors and extensors as well as his plantar flexors.  His imaging was obtained without clear evidence of localizing compression.  He does have some mid to low lumbar foraminal disease, however his weakness pattern is more in the upper lumbar region.  Cervical exam showed no evidence of severe compression.  There are some slices where it appears that he has bilateral dorsal column signal abnormalities.  Sometimes this can be seen in vitamin deficiency, inflammatory issues, or tabes dorsalis, I would recommend evaluation by  neurology.  This pattern can sometimes be seen in compressive myelopathy, however it is not noted to have significant compression at that level.  At this point no surgical intervention is planned.   Carroll Clamp, MD/MSCR Dept. of Neurosurgery

## 2024-05-30 NOTE — Plan of Care (Signed)
   Problem: Education: Goal: Knowledge of General Education information will improve Description: Including pain rating scale, medication(s)/side effects and non-pharmacologic comfort measures Outcome: Progressing   Problem: Health Behavior/Discharge Planning: Goal: Ability to manage health-related needs will improve Outcome: Progressing   Problem: Clinical Measurements: Goal: Ability to maintain clinical measurements within normal limits will improve Outcome: Progressing Goal: Will remain free from infection Outcome: Progressing Goal: Diagnostic test results will improve Outcome: Progressing Goal: Respiratory complications will improve Outcome: Progressing Goal: Cardiovascular complication will be avoided Outcome: Progressing   Problem: Activity: Goal: Risk for activity intolerance will decrease Outcome: Progressing   Problem: Nutrition: Goal: Adequate nutrition will be maintained Outcome: Progressing   Problem: Coping: Goal: Level of anxiety will decrease Outcome: Progressing   Problem: Elimination: Goal: Will not experience complications related to bowel motility Outcome: Progressing Goal: Will not experience complications related to urinary retention Outcome: Progressing   Problem: Pain Managment: Goal: General experience of comfort will improve and/or be controlled Outcome: Progressing   Problem: Safety: Goal: Ability to remain free from injury will improve Outcome: Progressing   Problem: Skin Integrity: Goal: Risk for impaired skin integrity will decrease Outcome: Progressing   Problem: Clinical Measurements: Goal: Ability to avoid or minimize complications of infection will improve Outcome: Progressing   Problem: Skin Integrity: Goal: Skin integrity will improve Outcome: Progressing   Problem: Clinical Measurements: Goal: Ability to avoid or minimize complications of infection will improve Outcome: Progressing   Problem: Skin Integrity: Goal: Skin  integrity will improve Outcome: Progressing

## 2024-05-30 NOTE — Progress Notes (Signed)
 Progress Note   Patient: Charles Park ZOX:[id number] DOB: September 25, 1966 DOA: 05/22/2024     8 DOS: the patient was seen and examined on 05/30/2024   Brief hospital course: Burke Terry is a 58 y.o. male with medical history significant of morbid obesity, HTN, presented with worsening of complaints about worsening bilateral leg pain, for knee pain and bilateral feet pain.   Symptoms started 5 days ago, patient started to have bilateral lower extremity pains distributed on bilateral hips bilateral knees and bilateral feet, had to use crutches to ambulate for 2 days.  3 days ago, symptoms became worse and patient found it was extremely difficult to even standing on his feet with excruciating pain of bilateral hips bilateral knees and bilateral feet, as result he has been staying in the recliner for the last 3 days without being to walk.  He denied any numbness or weakness of bilateral lower extremities.  Family also found patient has had increasing swelling and rash of bilateral lower extremities more on the right side below the knees.  Denies any fever chills no shortness of breath or cough.  Denies any trouble urinating or bowel movement.  No numbness or tingling    ED Course: Blood pressure elevated 160/90, not tachycardia afebrile.  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  Neurosurgeon reviewed lumbar MRI and recommend conservative management.          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  US  of b/l LE neg for DVT.  Continue Norco, morphine  prn. Flexeril prn.  Still not able to ambulate w/  therapy or independently.  MRI of the cervical spine showed  C4-C5, moderate to severe right and moderate left foraminal stenosis. C6-C7, mild to moderate right foraminal stenosis. No significant canal stenosis. Neurosurgery consult placed.  Input appreciated PT/OT recs SNF but pt does not have any insurance. Medicaid application is pending as per CM. Neuro surg recommended conservative management.     Bilateral lower extremity cellulitis: w/ edema.  Completed 5 day course of IV ancef .    B/l hand & wrist pain & weakness:  Etiology unclear. X-ray of the cervical spine shows mild-to-moderate C4-5 and C6-7 degenerative disc and endplate changes. Mild bilateral C4-5 neuroforaminal narrowing. Mild retrolisthesis of C4 on C5 and mild grade 1 anterolisthesis of C5 on C6. DDX, gout vs pseudogout vs viral illness vs carpal tunnel.  Started on prednisone x 5 days. If no improvement consider neuro consulted     Morbid obesity: BMI 43.7.  Complicates overall care & prognosis     Hyponatremia:  Secondary to hydrochlorothiazide  use Monitor closely   Constipation:  Continue on colace, miralax & lactulose      Hyperglycemia Related to systemic steroid therapy No known history of diabetes Obtain hemoglobin A1c levels Check blood sugars with meals        Subjective: Patient is seen and examined at the bedside.  No new complaints  Physical Exam: Vitals:   05/29/24 2100 05/30/24 0240 05/30/24 0729 05/30/24 0852  BP: 132/74 137/78 (!) 146/89 (!) 146/89  Pulse: 79 81 83   Resp: 18 18  16   Temp: 98.3 F (36.8 C) 97.9 F (36.6 C) 98.7 F (37.1 C)   TempSrc: Oral Oral Oral   SpO2: 99% 96% 100%   Weight:      Height:       General exam: appears calm & comfortable. Morbidly obese Respiratory system: Bilateral air entry Cardiovascular system: S1/S2+. No rubs or clicks  Gastrointestinal system: abd is soft, NT, obese & bowel sounds are present Central nervous system: Alert & awake.  Moves all extremities  Psychiatry: judgement and insight appears at baseline. Flat mood and affect   Data Reviewed:  There are no new results to review at this time.  Family Communication: Plan of care was discussed with patient.  He verbalizes understanding and agrees with the plan  Disposition: Status is: Inpatient Remains inpatient appropriate because: Awaiting discharge to SNF  Planned Discharge Destination: Skilled nursing facility    Time spent: 38 minutes  Author: Read Camel, MD 05/30/2024 1:08 PM  For on call review [email or web address].

## 2024-05-30 NOTE — Progress Notes (Signed)
 Inpatient Rehabilitation Admissions Coordinator   Patient continues to not be at a level to tolerate the intensity required of a CIR admit. Other rehab venues should be pursued.  Jeannetta Millman, RN, MSN Rehab Admissions Coordinator [phone] 05/30/2024 3:15 PM

## 2024-05-30 NOTE — Plan of Care (Signed)
  Problem: Education: Goal: Knowledge of General Education information will improve Description: Including pain rating scale, medication(s)/side effects and non-pharmacologic comfort measures Outcome: Progressing   Problem: Clinical Measurements: Goal: Ability to maintain clinical measurements within normal limits will improve Outcome: Progressing Goal: Will remain free from infection Outcome: Progressing Goal: Respiratory complications will improve Outcome: Progressing Goal: Cardiovascular complication will be avoided Outcome: Progressing   Problem: Nutrition: Goal: Adequate nutrition will be maintained Outcome: Progressing   Problem: Coping: Goal: Level of anxiety will decrease Outcome: Progressing   Problem: Elimination: Goal: Will not experience complications related to bowel motility Outcome: Progressing Goal: Will not experience complications related to urinary retention Outcome: Progressing   Problem: Pain Managment: Goal: General experience of comfort will improve and/or be controlled Outcome: Progressing   Problem: Safety: Goal: Ability to remain free from injury will improve Outcome: Progressing   Problem: Skin Integrity: Goal: Risk for impaired skin integrity will decrease Outcome: Progressing   Problem: Clinical Measurements: Goal: Ability to avoid or minimize complications of infection will improve Outcome: Progressing   Problem: Skin Integrity: Goal: Skin integrity will improve Outcome: Progressing   Problem: Clinical Measurements: Goal: Ability to avoid or minimize complications of infection will improve Outcome: Progressing   Problem: Skin Integrity: Goal: Skin integrity will improve Outcome: Progressing   Problem: Coping: Goal: Ability to adjust to condition or change in health will improve Outcome: Progressing   Problem: Fluid Volume: Goal: Ability to maintain a balanced intake and output will improve Outcome: Progressing   Problem:  Health Behavior/Discharge Planning: Goal: Ability to identify and utilize available resources and services will improve Outcome: Progressing Goal: Ability to manage health-related needs will improve Outcome: Progressing   Problem: Nutritional: Goal: Maintenance of adequate nutrition will improve Outcome: Progressing Goal: Progress toward achieving an optimal weight will improve Outcome: Progressing   Problem: Skin Integrity: Goal: Risk for impaired skin integrity will decrease Outcome: Progressing   Problem: Tissue Perfusion: Goal: Adequacy of tissue perfusion will improve Outcome: Progressing

## 2024-05-31 LAB — SEDIMENTATION RATE: Sed Rate: 82 mm/h — ABNORMAL HIGH (ref 0–20)

## 2024-05-31 LAB — VITAMIN B12: Vitamin B-12: 670 pg/mL (ref 180–914)

## 2024-05-31 LAB — GLUCOSE, CAPILLARY
Glucose-Capillary: 136 mg/dL — ABNORMAL HIGH (ref 70–99)
Glucose-Capillary: 140 mg/dL — ABNORMAL HIGH (ref 70–99)
Glucose-Capillary: 302 mg/dL — ABNORMAL HIGH (ref 70–99)
Glucose-Capillary: 282 mg/dL — ABNORMAL HIGH (ref 70–99)

## 2024-05-31 LAB — C-REACTIVE PROTEIN: CRP: 14.5 mg/dL — ABNORMAL HIGH (ref <1.0)

## 2024-05-31 LAB — HEPATITIS PANEL, ACUTE
HCV Ab: NONREACTIVE
Hep A IgM: NONREACTIVE
Hep B C IgM: NONREACTIVE
Hepatitis B Surface Ag: NONREACTIVE

## 2024-05-31 LAB — TSH: TSH: 2.08 u[IU]/mL (ref 0.350–4.500)

## 2024-05-31 LAB — VITAMIN D 25 HYDROXY (VIT D DEFICIENCY, FRACTURES): Vit D, 25-Hydroxy: 18.12 ng/mL — ABNORMAL LOW (ref 30–100)

## 2024-05-31 LAB — HIV ANTIBODY (ROUTINE TESTING W REFLEX): HIV Screen 4th Generation wRfx: NONREACTIVE

## 2024-05-31 LAB — FOLATE: Folate: 13.4 ng/mL (ref >5.9)

## 2024-05-31 MED ORDER — VITAMIN D (ERGOCALCIFEROL) 1.25 MG (50000 UNIT) PO CAPS
50000.0000 [IU] | ORAL_CAPSULE | ORAL | Status: DC
  Administered 2024-05-31: 50000 [IU] via ORAL
  Filled 2024-05-31: qty 1

## 2024-05-31 MED ORDER — VITAMIN B-12 1000 MCG PO TABS
1000.0000 ug | ORAL_TABLET | Freq: Every day | ORAL | Status: DC
  Administered 2024-05-31 – 2024-06-03 (×4): 1000 ug via ORAL
  Filled 2024-05-31 (×4): qty 1

## 2024-05-31 MED ORDER — LIDOCAINE 4 % EX CREA
TOPICAL_CREAM | Freq: Three times a day (TID) | CUTANEOUS | Status: DC
  Filled 2024-05-31: qty 5

## 2024-05-31 MED ORDER — THIAMINE MONONITRATE 100 MG PO TABS
100.0000 mg | ORAL_TABLET | Freq: Every day | ORAL | Status: DC
  Administered 2024-06-01 – 2024-06-03 (×3): 100 mg via ORAL
  Filled 2024-05-31 (×3): qty 1

## 2024-05-31 NOTE — TOC Transition Note (Signed)
 Transition of Care [facility]) - Discharge Note   Patient Details  Name: Charles Park MRN: [id number] Date of Birth: 04-26-1966  Transition of Care [facility]) CM/SW Contact:  Alexandra Ice, RN Phone Number: 05/31/2024, 12:04 PM   Clinical Narrative:     Met with patient and sister, Charles Park, at bedside. Discussed discharge plan after skilled rehab stay. He stated he has family that can assist him, or if needed he can stay with until he is completely independent. He has a nephew that will be mowing his grass until he is able too. His niece is about 10 minutes away from his home and is able to assist as well. Updated TOC manager on patient's support system.          Patient Goals and CMS Choice            Discharge Placement                       Discharge Plan and Services Additional resources added to the After Visit Summary for                                       Social Drivers of Health (SDOH) Interventions SDOH Screenings   Food Insecurity: Food Insecurity Present (05/22/2024)  Housing: Low Risk  (05/22/2024)  Transportation Needs: No Transportation Needs (05/22/2024)  Utilities: At Risk (05/22/2024)  Tobacco Use: Medium Risk (05/22/2024)     Readmission Risk Interventions     No data to display

## 2024-05-31 NOTE — Progress Notes (Signed)
 Progress Note   Patient: Charles Park ZOX:[id number] DOB: 1966/05/18 DOA: 05/22/2024     9 DOS: the patient was seen and examined on 05/31/2024   Brief hospital course:  Charles Park is a 58 y.o. male with hx of arthritis and chronic alcohol use presenting with pain and difficulty ambulating for which neurology is aksed to evaluate.    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 He experienced a painful right foot, which he could not put weight on, and noticed soreness in "the pivot muscle" on the top of the foot. The pain improved slightly after resting. This same day he also noticed difficulty reaching up to lift boxes onto high shelves   An episode occurred where he was mowing the lawn about five days after the initial foot pain. He felt his shoe twist, which did not bother him at the time, but later led to increased pain, prompting the use of crutches.    He reports difficulty with fine motor tasks such as buttoning buttons and snapping fingers, ongoing for about a year, slowly progressive. Some improvement in these symptoms has been noted since being hospitalized. He also experienced muscle spasms in the hips and hamstring area before hospitalization, which have since improved.    Regarding proximal upper extremity weakness, he reports when he started his current job 1 year ago he did experienced difficulty lifting boxes onto shelves, but felt his shoulders became stronger over time, until the week prior to admission   Generally symptoms improve over the course of the day   No numbness, vision changes, hearing difficulties, or speech and swallowing issues. No chest pain, shortness of breath, nausea, vomiting, diarrhea, or urinary issues. He has experienced muscle cramps, attributed to possible magnesium deficiency, but these have improved since hospitalization.   He has a history of heavy alcohol consumption,  drinking approximately twelve beers a day, but stopped two weeks ago following the onset of his symptoms. He has not resumed drinking since hospitalization and reports a poor diet as a single individual.    Of note, had a previous episode five years ago when he experienced similar symptoms and was unable to walk, which resolved after he stopped drinking (was drinking heavily at the time, stopped and subsequently restarted).   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  US  of b/l LE neg for DVT.  Still not able to ambulate w/ therapy or independently.  MRI of the cervical spine showed  C4-C5, moderate to severe right and moderate left foraminal stenosis. C6-C7, mild to moderate right foraminal stenosis. No significant canal stenosis. Neurosurgery consult placed.  Input appreciated Appreciate neurology input, myelopathy/neuropathic workup pending PT/OT recs SNF but pt does not have any insurance. Medicaid application is pending as per CM. Neuro surg recommended conservative management.  Continue empiric therapy with thiamine and vitamin B12      Bilateral lower extremity cellulitis: w/ edema.  Completed 5 day course of IV ancef .     B/l hand & wrist pain & weakness:  Etiology unclear. X-ray of the cervical spine shows mild-to-moderate C4-5 and C6-7 degenerative disc and endplate changes. Mild bilateral C4-5 neuroforaminal  narrowing. Mild retrolisthesis of C4 on C5 and mild grade 1 anterolisthesis of C5 on C6. DDX, gout vs pseudogout  Started on prednisone x 5 days.      Morbid obesity: BMI 43.7.  Complicates overall care & prognosis      Hyponatremia:  Secondary to hydrochlorothiazide  use Monitor closely    Constipation:  Continue on colace, miralax & lactulose      Hyperglycemia Related to systemic steroid therapy No known history of diabetes Hemoglobin A1c is 5.8 Check blood sugars with meals Sliding scale insulin coverage               Subjective: No new complaints  Physical Exam: Vitals:   05/30/24 1500 05/30/24 1938 05/31/24 0420 05/31/24 0818  BP: (!) 149/83 129/63 (!) 154/85 (!) 142/92  Pulse: 90 93 82 83  Resp: 15 16 18 16   Temp: 98.4 F (36.9 C) 98.5 F (36.9 C) 98 F (36.7 C) 98.9 F (37.2 C)  TempSrc: Oral Oral Oral   SpO2: 100% 95% 99% 92%  Weight:      Height:       General exam: appears calm & comfortable. Morbidly obese Respiratory system: Bilateral air entry Cardiovascular system: S1/S2+. No rubs or clicks  Gastrointestinal system: abd is soft, NT, obese & bowel sounds are present Central nervous system: Alert & awake. Moves all extremities  Psychiatry: judgement and insight appears at baseline. Flat mood and affect   Data Reviewed: ESR 82, TSH 2.08, folate 13.4 Labs reviewed  Family Communication: Plan of care discussed with patient at the bedside.  He verbalizes understanding and agrees with the plan.  Disposition: Status is: Inpatient Remains inpatient appropriate because: Awaiting discharge to SNF  Planned Discharge Destination: Skilled nursing facility    Time spent: 38 minutes  Author: Read Camel, MD 05/31/2024 3:29 PM  For on call review [email or web address].

## 2024-05-31 NOTE — Plan of Care (Signed)
  Problem: Education: Goal: Knowledge of General Education information will improve Description: Including pain rating scale, medication(s)/side effects and non-pharmacologic comfort measures Outcome: Progressing   Problem: Health Behavior/Discharge Planning: Goal: Ability to manage health-related needs will improve Outcome: Progressing   Problem: Clinical Measurements: Goal: Ability to maintain clinical measurements within normal limits will improve Outcome: Progressing Goal: Will remain free from infection Outcome: Progressing Goal: Diagnostic test results will improve Outcome: Progressing Goal: Respiratory complications will improve Outcome: Progressing Goal: Cardiovascular complication will be avoided Outcome: Progressing   Problem: Activity: Goal: Risk for activity intolerance will decrease Outcome: Progressing   Problem: Nutrition: Goal: Adequate nutrition will be maintained Outcome: Progressing   Problem: Coping: Goal: Level of anxiety will decrease Outcome: Progressing   Problem: Elimination: Goal: Will not experience complications related to bowel motility Outcome: Progressing Goal: Will not experience complications related to urinary retention Outcome: Progressing   Problem: Pain Managment: Goal: General experience of comfort will improve and/or be controlled Outcome: Progressing   Problem: Safety: Goal: Ability to remain free from injury will improve Outcome: Progressing   Problem: Skin Integrity: Goal: Risk for impaired skin integrity will decrease Outcome: Progressing   Problem: Clinical Measurements: Goal: Ability to avoid or minimize complications of infection will improve Outcome: Progressing   Problem: Skin Integrity: Goal: Skin integrity will improve Outcome: Progressing   Problem: Clinical Measurements: Goal: Ability to avoid or minimize complications of infection will improve Outcome: Progressing   Problem: Skin Integrity: Goal: Skin  integrity will improve Outcome: Progressing   Problem: Education: Goal: Ability to describe self-care measures that may prevent or decrease complications (Diabetes Survival Skills Education) will improve Outcome: Progressing Goal: Individualized Educational Video(s) Outcome: Progressing   Problem: Coping: Goal: Ability to adjust to condition or change in health will improve Outcome: Progressing   Problem: Fluid Volume: Goal: Ability to maintain a balanced intake and output will improve Outcome: Progressing   Problem: Health Behavior/Discharge Planning: Goal: Ability to identify and utilize available resources and services will improve Outcome: Progressing Goal: Ability to manage health-related needs will improve Outcome: Progressing   Problem: Metabolic: Goal: Ability to maintain appropriate glucose levels will improve Outcome: Progressing   Problem: Nutritional: Goal: Maintenance of adequate nutrition will improve Outcome: Progressing Goal: Progress toward achieving an optimal weight will improve Outcome: Progressing   Problem: Skin Integrity: Goal: Risk for impaired skin integrity will decrease Outcome: Progressing   Problem: Tissue Perfusion: Goal: Adequacy of tissue perfusion will improve Outcome: Progressing

## 2024-05-31 NOTE — Consult Note (Signed)
 NEUROLOGY CONSULT NOTE   Date of service: May 31, 2024 Patient Name: Charles Park MRN:  [id number] DOB:  1966/04/28 Chief Complaint: "Leg pain" Requesting Provider: Read Camel, MD  History of Present Illness  Charles Park is a 58 y.o. male with hx of arthritis and chronic alcohol use presenting with pain and difficulty ambulating for which neurology is aksed to evaluate.   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 He experienced a painful right foot, which he could not put weight on, and noticed soreness in "the pivot muscle" on the top of the foot. The pain improved slightly after resting. This same day he also noticed difficulty reaching up to lift boxes onto high shelves  An episode occurred where he was mowing the lawn about five days after the initial foot pain. He felt his shoe twist, which did not bother him at the time, but later led to increased pain, prompting the use of crutches.   He reports difficulty with fine motor tasks such as buttoning buttons and snapping fingers, ongoing for about a year, slowly progressive. Some improvement in these symptoms has been noted since being hospitalized. He also experienced muscle spasms in the hips and hamstring area before hospitalization, which have since improved.   Regarding proximal upper extremity weakness, he reports when he started his current job 1 year ago he did experienced difficulty lifting boxes onto shelves, but felt his shoulders became stronger over time, until the week prior to admission  Generally symptoms improve over the course of the day  No numbness, vision changes, hearing difficulties, or speech and swallowing issues. No chest pain, shortness of breath, nausea, vomiting, diarrhea, or urinary issues. He has experienced muscle cramps, attributed to possible magnesium deficiency, but these have improved since hospitalization.  He has  a history of heavy alcohol consumption, drinking approximately twelve beers a day, but stopped two weeks ago following the onset of his symptoms. He has not resumed drinking since hospitalization and reports a poor diet as a single individual.   Of note, had a previous episode five years ago when he experienced similar symptoms and was unable to walk, which resolved after he stopped drinking (was drinking heavily at the time, stopped and subsequently restarted).  In the hospital, he was treated for 7 days with cefazolin  which lead to improvement in his rash (felt to be cellulitis)  He was also started on prednisone 50 mg daily on 6/3    ROS  Comprehensive ROS performed and pertinent positives documented in HPI   Past History  History reviewed. No pertinent past medical history.  History reviewed. No pertinent surgical history.  Family History: History reviewed. No pertinent family history.  Social History  reports that he has quit smoking. He has never used smokeless tobacco. No history on file for alcohol use and drug use. Reports 12 pack daily and   No Known Allergies  Medications   Current Facility-Administered Medications:    acetaminophen  (TYLENOL ) tablet 650 mg, 650 mg, Oral, Q6H PRN, Zhang, Ping T, MD, 650 mg at 05/23/24 1452   cyclobenzaprine (FLEXERIL) tablet 7.5 mg, 7.5 mg, Oral, TID PRN, Alphonsus Jeans, MD, 7.5 mg at 05/31/24 0453   diclofenac Sodium (VOLTAREN) 1 % topical gel 2 g, 2 g, Topical, QID, Mansy, Jan A, MD, 2 g at 05/30/24 2116   docusate sodium (COLACE) capsule 200 mg, 200 mg, Oral, BID, Alphonsus Jeans, MD, 200 mg at  05/30/24 2116   enoxaparin  (LOVENOX ) injection 80 mg, 0.5 mg/kg, Subcutaneous, Q24H, Antoniette Batty T, MD, 80 mg at 05/30/24 2116   hydrochlorothiazide  (HYDRODIURIL ) tablet 12.5 mg, 12.5 mg, Oral, Daily, Antoniette Batty T, MD, 12.5 mg at 05/30/24 1610   HYDROcodone -acetaminophen  (NORCO) 10-325 MG per tablet 1 tablet, 1 tablet, Oral, Q4H PRN,  Alphonsus Jeans, MD, 1 tablet at 05/31/24 0441   insulin aspart (novoLOG) injection 0-15 Units, 0-15 Units, Subcutaneous, TID WC, Agbata, Tochukwu, MD, 15 Units at 05/30/24 1748   lactulose (CHRONULAC) 10 GM/15ML solution 30 g, 30 g, Oral, BID, Alphonsus Jeans, MD, 30 g at 05/30/24 2116   lisinopril  (ZESTRIL ) tablet 20 mg, 20 mg, Oral, Daily, Antoniette Batty T, MD, 20 mg at 05/30/24 9604   morphine  (PF) 2 MG/ML injection 2 mg, 2 mg, Intravenous, Q4H PRN, Alphonsus Jeans, MD, 2 mg at 05/28/24 1827   ondansetron  (ZOFRAN ) tablet 4 mg, 4 mg, Oral, Q6H PRN **OR** ondansetron  (ZOFRAN ) injection 4 mg, 4 mg, Intravenous, Q6H PRN, [place], MD   Oral care mouth rinse, 15 mL, Mouth Rinse, PRN, Agbata, Tochukwu, MD   polyethylene glycol (MIRALAX / GLYCOLAX) packet 17 g, 17 g, Oral, Daily, Alphonsus Jeans, MD, 17 g at 05/30/24 0855   predniSONE (DELTASONE) tablet 50 mg, 50 mg, Oral, Q breakfast, Alphonsus Jeans, MD, 50 mg at 05/30/24 0852  Vitals   Vitals:   05/30/24 1500 05/30/24 1938 05/31/24 0420 05/31/24 0818  BP: (!) 149/83 129/63 (!) 154/85 (!) 142/92  Pulse: 90 93 82 83  Resp: 15 16 18 16   Temp: 98.4 F (36.9 C) 98.5 F (36.9 C) 98 F (36.7 C) 98.9 F (37.2 C)  TempSrc: Oral Oral Oral   SpO2: 100% 95% 99% 92%  Weight:      Height:        Body mass index is 43.75 kg/m.   Physical Exam   Constitutional: Appears comfortable Psych: Affect appropriate to situation, very pleasant, cooperative  Eyes: No scleral injection HENT: No oropharyngeal obstruction.  MSK: no joint deformities.  Cardiovascular: Normal rate and regular rhythm. Perfusing extremities well Respiratory: Effort normal, non-labored breathing GI: Soft.  No distension. There is no tenderness.  Skin: Warm dry and intact visible skin  Neurologic Examination   Mental Status: Patient is awake, alert, oriented to person, place, month, year, and situation. Patient is able to give a clear and coherent  history. No signs of aphasia or neglect Cranial Nerves: II: Visual Fields are full. Pupils are equal, round, and reactive to light.   III,IV, VI: EOMI without ptosis or diploplia. Mildly saccadic pursuits V: Facial sensation is symmetric to temperature and light touch VII: Facial movement is symmetric.  VIII: hearing is intact to voice X: Uvula elevates symmetrically XI: Shoulder shrug is symmetric. XII: tongue is midline without atrophy or fasciculations.  Motor: Loss of muscle bulk in the hands particularly is noted. Foot musculature difficult to evaluate in the setting of edema/chronic skin changes.  Sensory: Length dependent loss of temperature sensation in the arms and legs. Proprioception intact at the toes.  Deep Tendon Reflexes: 1+ and symmetric in the brachioradialis, biceps, triceps.  Cerebellar: Finger to nose intact bilaterally  Gait:  Deferred for safety   Labs/Imaging/Neurodiagnostic studies    Basic Metabolic Panel: Recent Labs  Lab 05/25/24 0527 05/26/24 0529 05/27/24 0415 05/28/24 0309 05/29/24 0157  NA 132* 130* 132* 133* 131*  K 3.7 3.7 4.0 3.8 4.3  CL 98 95* 96*  96* 96*  CO2 24 25 25 25 25   GLUCOSE 142* 171* 138* 186* 259*  BUN 17 23* 24* 32* 36*  CREATININE 0.77 0.80 0.72 0.86 0.79  CALCIUM 8.9 9.1 9.3 9.2 9.4    CBC: Recent Labs  Lab 05/29/24 0157  WBC 12.8*  HGB 12.7*  HCT 38.8*  MCV 93.7  PLT 454*    HgbA1c:  Lab Results  Component Value Date   HGBA1C 5.8 (H) 05/29/2024   MR lumbar spine: 1. 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 2. Moderate to severe neural foraminal stenosis at L3-4 and moderate foraminal stenosis at L4-5 and L5-S1.    MRI C-spine(Personally reviewed): 1. At C4-C5, moderate to severe right and moderate left foraminal stenosis. At C6-C7, mild to moderate right foraminal stenosis. 2. No significant canal stenosis. On MEDIC sequences as documented in  neurosurgery note, there are some areas of signal abnormality but these are patchy and inconsistent at different cuts and do not have a clear correlate on sagittal imaging with some limitation from motion  Knee x-rays 1. Bilateral 3 compartmental osteoarthritis and moderate knee effusions. 2. No acute displaced fractures. 3. Diffuse subcutaneous edema bilaterally.  Foot x-rays: 1. No acute displaced fractures within either ankle. 2. Symmetrical osteoarthritis of the bilateral ankles, hind feet, and mid feet. 3. Pes planus deformity bilaterally. 4. Extensive bilateral soft tissue edema.  Left wrist x-ray 1. Abnormal widening of the scapholunate interval up to 6 mm, compatible with scapholunate ligament tear. 2. Minimal degenerative spurring at the triscaphe joint and thumb carpometacarpal joint.  Right wrist x-ray  1. Abnormal widening of the scapholunate interval measuring 9 mm. Mild proximal migration of the capitate. This is consistent with scapholunate advanced collapse (SLAC wrist). 2. Mild radioscaphoid and triscaphe osteoarthritis. 3. Mild to moderate distal radioulnar osteoarthritis.  ASSESSMENT   Romyn Boswell is a 58 y.o. male with a past medical history of arthritis and alcohol abuse presenting with pain and weakness.   Suspect vitamin deficiencies may be playing a significant role especially given improvement reported both in PT notes and by patient and history of heavy alcohol use and similar episode resolving with alcohol cessation. Do not think there is a primary neuroinflammatory condition such as transverse myelitis (only treated with pred 50 mg daily for the past few days). Therefore do not feel need for LP at this time or further imaging of thoracic spine. Will however complete serological testing for myelopathy.  With reports of symptoms improving over the day typically, inflammatory arthritis is a consideration. 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 Discussed with Dr. Meyer Ada via secure chat ______________________________________________________________________  Baldwin Levee MD-PhD [facility] [phone] Available 7 AM to 7 PM, outside these hours please contact Neurologist on call listed on AMION

## 2024-06-01 LAB — GLUCOSE, CAPILLARY
Glucose-Capillary: 255 mg/dL — ABNORMAL HIGH (ref 70–99)
Glucose-Capillary: 210 mg/dL — ABNORMAL HIGH (ref 70–99)
Glucose-Capillary: 273 mg/dL — ABNORMAL HIGH (ref 70–99)
Glucose-Capillary: 307 mg/dL — ABNORMAL HIGH (ref 70–99)

## 2024-06-01 LAB — RPR: RPR Ser Ql: NONREACTIVE

## 2024-06-01 LAB — ANA W/REFLEX IF POSITIVE: Anti Nuclear Antibody (ANA): NEGATIVE

## 2024-06-01 LAB — HOMOCYSTEINE: Homocysteine: 13.2 umol/L (ref 0.0–14.5)

## 2024-06-01 MED ORDER — NAPROXEN 500 MG PO TABS
500.0000 mg | ORAL_TABLET | Freq: Two times a day (BID) | ORAL | Status: DC
  Administered 2024-06-01 – 2024-06-03 (×4): 500 mg via ORAL
  Filled 2024-06-01 (×5): qty 1

## 2024-06-01 NOTE — Plan of Care (Signed)
   Problem: Education: Goal: Knowledge of General Education information will improve Description Including pain rating scale, medication(s)/side effects and non-pharmacologic comfort measures Outcome: Progressing

## 2024-06-01 NOTE — TOC Progression Note (Signed)
 Transition of Care [facility]) - Progression Note    Patient Details  Name: Daymon Hora MRN: [id number] Date of Birth: [birth year]  Transition of Care [facility]) CM/SW Contact  Alexandra Ice, RN Phone Number: 06/01/2024, 1:06 PM  Clinical Narrative:     Received update from financial navigator regarding update on Medicaid application, stated, it is pending with [employer].        Expected Discharge Plan and Services                                               Social Determinants of Health (SDOH) Interventions SDOH Screenings   Food Insecurity: Food Insecurity Present (05/22/2024)  Housing: Low Risk  (05/22/2024)  Transportation Needs: No Transportation Needs (05/22/2024)  Utilities: At Risk (05/22/2024)  Tobacco Use: Medium Risk (05/22/2024)    Readmission Risk Interventions     No data to display

## 2024-06-01 NOTE — Progress Notes (Signed)
 Progress Note   Patient: Charles Park AVW:[id number] DOB: May 19, 1966 DOA: 05/22/2024     10 DOS: the patient was seen and examined on 06/01/2024   Brief hospital course:  Jaylon Boylen is a 58 y.o. male with hx of arthritis and chronic alcohol use presenting with pain and difficulty ambulating for which neurology is aksed to evaluate.    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 He experienced a painful right foot, which he could not put weight on, and noticed soreness in "the pivot muscle" on the top of the foot. The pain improved slightly after resting. This same day he also noticed difficulty reaching up to lift boxes onto high shelves   An episode occurred where he was mowing the lawn about five days after the initial foot pain. He felt his shoe twist, which did not bother him at the time, but later led to increased pain, prompting the use of crutches.    He reports difficulty with fine motor tasks such as buttoning buttons and snapping fingers, ongoing for about a year, slowly progressive. Some improvement in these symptoms has been noted since being hospitalized. He also experienced muscle spasms in the hips and hamstring area before hospitalization, which have since improved.    Regarding proximal upper extremity weakness, he reports when he started his current job 1 year ago he did experienced difficulty lifting boxes onto shelves, but felt his shoulders became stronger over time, until the week prior to admission   Generally symptoms improve over the course of the day   No numbness, vision changes, hearing difficulties, or speech and swallowing issues. No chest pain, shortness of breath, nausea, vomiting, diarrhea, or urinary issues. He has experienced muscle cramps, attributed to possible magnesium deficiency, but these have improved since hospitalization.   He has a history of heavy alcohol consumption,  drinking approximately twelve beers a day, but stopped two weeks ago following the onset of his symptoms. He has not resumed drinking since hospitalization and reports a poor diet as a single individual.    Of note, had a previous episode five years ago when he experienced similar symptoms and was unable to walk, which resolved after he stopped drinking (was drinking heavily at the time, stopped and subsequently restarted).   In the hospital, he was treated for 7 days with cefazolin  which lead to improvement in his rash (felt to be cellulitis)  He was also started on prednisone  50 mg daily on 6/3       Assessment and Plan:  Acute ambulatory impairment:  Etiology unclear, possibly secondary to multi-joint OA & foraminal stenosis.  US  of b/l LE neg for DVT.  Still not able to ambulate w/ therapy or independently.  MRI of the cervical spine showed  C4-C5, moderate to severe right and moderate left foraminal stenosis. C6-C7, mild to moderate right foraminal stenosis. No significant canal stenosis. Neurosurgery consult placed.  Input appreciated Appreciate neurology input, myelopathy/neuropathic workup with place CRP and ESR elevated.  Normal folate and B12 levels.  RPR nonreactive, HIV nonreactive and hepatitis panel negative. PT/OT recs SNF but pt does not have any insurance. Medicaid application is pending as per CM. Neuro surg recommended conservative management.  Continue empiric therapy with thiamine  and vitamin B12       Bilateral lower extremity cellulitis: w/ edema.  Completed 5 day course of IV ancef .      B/l hand & wrist pain & weakness:  Etiology unclear. X-ray of the cervical spine shows mild-to-moderate C4-5 and C6-7 degenerative disc and endplate changes. Mild bilateral C4-5 neuroforaminal narrowing. Mild retrolisthesis of C4 on C5 and mild grade 1 anterolisthesis of C5 on C6. DDX, gout vs pseudogout  Completed a 5 day course of prednisone   Trial of NSAIDs     Morbid  obesity: BMI 43.7.  Complicates overall care & prognosis      Hyponatremia:  Secondary to hydrochlorothiazide  use Monitor closely   Constipation:  Continue on colace, miralax  & lactulose       Hyperglycemia Related to systemic steroid therapy No known history of diabetes Hemoglobin A1c is 5.8 Check blood sugars with meals Sliding scale insulin  coverage            Subjective: Patient is seen and examined at the bedside.  No new complaints  Physical Exam: Vitals:   06/01/24 0500 06/01/24 0759 06/01/24 0839 06/01/24 1334  BP: 136/74 119/77  (!) 149/78  Pulse:   80 88  Resp: 18 17  18   Temp: 98.5 F (36.9 C) 98.4 F (36.9 C)  (!) 97.5 F (36.4 C)  TempSrc: Oral Oral  Oral  SpO2: 100% 98%  96%  Weight:      Height:       General exam: appears calm & comfortable. Morbidly obese Respiratory system: Bilateral air entry Cardiovascular system: S1/S2+. No rubs or clicks  Gastrointestinal system: abd is soft, NT, obese & bowel sounds are present Central nervous system: Alert & awake. Moves all extremities  Psychiatry: judgement and insight appears at baseline. Flat mood and affect    Data Reviewed:  There are no new results to review at this time.  Family Communication: Plan of care was discussed with patient in detail.  He verbalizes understanding and agrees with the plan  Disposition: Status is: Inpatient Remains inpatient appropriate because: Awaiting discharge to SNF  Planned Discharge Destination: Skilled nursing facility    Time spent: 35 minutes  Author: Read Camel, MD 06/01/2024 2:21 PM  For on call review [email or web address].

## 2024-06-01 NOTE — Plan of Care (Signed)
  Problem: Education: Goal: Knowledge of General Education information will improve Description: Including pain rating scale, medication(s)/side effects and non-pharmacologic comfort measures Outcome: Progressing   Problem: Health Behavior/Discharge Planning: Goal: Ability to manage health-related needs will improve Outcome: Progressing   Problem: Clinical Measurements: Goal: Ability to maintain clinical measurements within normal limits will improve Outcome: Progressing Goal: Will remain free from infection Outcome: Progressing Goal: Diagnostic test results will improve Outcome: Progressing Goal: Respiratory complications will improve Outcome: Progressing Goal: Cardiovascular complication will be avoided Outcome: Progressing   Problem: Activity: Goal: Risk for activity intolerance will decrease Outcome: Progressing   Problem: Nutrition: Goal: Adequate nutrition will be maintained Outcome: Progressing   Problem: Coping: Goal: Level of anxiety will decrease Outcome: Progressing   Problem: Elimination: Goal: Will not experience complications related to bowel motility Outcome: Progressing Goal: Will not experience complications related to urinary retention Outcome: Progressing   Problem: Pain Managment: Goal: General experience of comfort will improve and/or be controlled Outcome: Progressing   Problem: Safety: Goal: Ability to remain free from injury will improve Outcome: Progressing   Problem: Skin Integrity: Goal: Risk for impaired skin integrity will decrease Outcome: Progressing   Problem: Clinical Measurements: Goal: Ability to avoid or minimize complications of infection will improve Outcome: Progressing   Problem: Skin Integrity: Goal: Skin integrity will improve Outcome: Progressing   Problem: Clinical Measurements: Goal: Ability to avoid or minimize complications of infection will improve Outcome: Progressing   Problem: Skin Integrity: Goal: Skin  integrity will improve Outcome: Progressing   Problem: Education: Goal: Ability to describe self-care measures that may prevent or decrease complications (Diabetes Survival Skills Education) will improve Outcome: Progressing Goal: Individualized Educational Video(s) Outcome: Progressing   Problem: Coping: Goal: Ability to adjust to condition or change in health will improve Outcome: Progressing   Problem: Fluid Volume: Goal: Ability to maintain a balanced intake and output will improve Outcome: Progressing   Problem: Health Behavior/Discharge Planning: Goal: Ability to identify and utilize available resources and services will improve Outcome: Progressing Goal: Ability to manage health-related needs will improve Outcome: Progressing   Problem: Metabolic: Goal: Ability to maintain appropriate glucose levels will improve Outcome: Progressing   Problem: Nutritional: Goal: Maintenance of adequate nutrition will improve Outcome: Progressing Goal: Progress toward achieving an optimal weight will improve Outcome: Progressing   Problem: Skin Integrity: Goal: Risk for impaired skin integrity will decrease Outcome: Progressing   Problem: Tissue Perfusion: Goal: Adequacy of tissue perfusion will improve Outcome: Progressing

## 2024-06-02 DIAGNOSIS — R531 Weakness: Secondary | ICD-10-CM | POA: Diagnosis not present

## 2024-06-02 DIAGNOSIS — M1990 Unspecified osteoarthritis, unspecified site: Secondary | ICD-10-CM

## 2024-06-02 DIAGNOSIS — F1090 Alcohol use, unspecified, uncomplicated: Secondary | ICD-10-CM | POA: Diagnosis not present

## 2024-06-02 DIAGNOSIS — E559 Vitamin D deficiency, unspecified: Secondary | ICD-10-CM

## 2024-06-02 DIAGNOSIS — R29898 Other symptoms and signs involving the musculoskeletal system: Secondary | ICD-10-CM | POA: Diagnosis not present

## 2024-06-02 DIAGNOSIS — R262 Difficulty in walking, not elsewhere classified: Secondary | ICD-10-CM | POA: Diagnosis not present

## 2024-06-02 LAB — GLUCOSE, CAPILLARY
Glucose-Capillary: 150 mg/dL — ABNORMAL HIGH (ref 70–99)
Glucose-Capillary: 143 mg/dL — ABNORMAL HIGH (ref 70–99)
Glucose-Capillary: 210 mg/dL — ABNORMAL HIGH (ref 70–99)
Glucose-Capillary: 199 mg/dL — ABNORMAL HIGH (ref 70–99)

## 2024-06-02 LAB — ZINC: Zinc: 60 ug/dL (ref 44–115)

## 2024-06-02 LAB — RHEUMATOID FACTOR: Rheumatoid fact SerPl-aCnc: 12 [IU]/mL (ref <14.0)

## 2024-06-02 LAB — COPPER, SERUM: Copper: 175 ug/dL — ABNORMAL HIGH (ref 69–132)

## 2024-06-02 NOTE — Progress Notes (Signed)
 NEUROLOGY CONSULT FOLLOW UP NOTE   Date of service: June 02, 2024 Patient Name: Charles Park MRN:  [id number] DOB:  July 12, 1966  Interval Hx/subjective   - Feels like he is improving  Vitals   Vitals:   06/01/24 0839 06/01/24 1334 06/01/24 2053 06/02/24 0721  BP:  (!) 149/78 139/80 127/82  Pulse: 80 88 88 88  Resp:  18 19 18   Temp:  (!) 97.5 F (36.4 C) 98.3 F (36.8 C) 98.2 F (36.8 C)  TempSrc:  Oral  Oral  SpO2:  96% 99% 98%  Weight:      Height:         Body mass index is 43.75 kg/m.  Physical Exam    Constitutional: Appears comfortable in bed  Psych: Affect appropriate to situation, very pleasant, cooperative  Eyes: No scleral injection HENT: No oropharyngeal obstruction.  MSK: no major joint deformities but some arthritic changes particular 2nd MCP right > L Cardiovascular: Perfusing extremities well Respiratory: Effort normal, non-labored breathing GI: Soft.  No distension. There is no tenderness.  Skin: Warm dry and intact visible skin   Neurologic Examination    Mental Status: Patient is awake, alert, oriented to person, place, month, year, and situation. Patient is able to give a clear and coherent history. No signs of aphasia or neglect Cranial Nerves: II: Visual Fields are full. Pupils are equal, round, and reactive to light.   III,IV, VI: EOMI without ptosis or diploplia. Mildly saccadic pursuits V: Facial sensation is symmetric to temperature and light touch VII: Facial movement is symmetric.  VIII: hearing is intact to voice X: Uvula elevates symmetrically XI: Shoulder shrug is symmetric. XII: tongue is midline without atrophy or fasciculations.  Motor: Loss of muscle bulk in the hands particularly is noted. Foot musculature difficult to evaluate in the setting of edema/chronic skin changes.  Strength improved today 5/5 bilateral upper extremities deltoids/ticeps/biceps, 4/5 grip bilaterally  4/5 right hip flexion 3/5 left hip flexion, 4+/5  knee extension, 4+/5 knee flexion on the right, left remained pain limited but at least 4/5 (Exam not documented 6/6 but was 4/5 at the deltoids and knee extension/flexion could not be tested due to posterior knee pain) Sensory: Length dependent loss of temperature sensation in the arms and legs. Proprioception intact at the toes. (Not retested today) Deep Tendon Reflexes: 1+ and symmetric in the brachioradialis, biceps, triceps.  Cerebellar: Finger to nose intact bilaterally  Gait:  Deferred for safety  Medications  Current Facility-Administered Medications:    acetaminophen  (TYLENOL ) tablet 650 mg, 650 mg, Oral, Q6H PRN, Antoniette Batty T, MD, 650 mg at 05/23/24 1452   cyanocobalamin  (VITAMIN B12) tablet 1,000 mcg, 1,000 mcg, Oral, Daily, Mili Piltz L, MD, 1,000 mcg at 06/02/24 0810   cyclobenzaprine  (FLEXERIL ) tablet 7.5 mg, 7.5 mg, Oral, TID PRN, Alphonsus Jeans, MD, 7.5 mg at 05/31/24 0453   diclofenac  Sodium (VOLTAREN ) 1 % topical gel 2 g, 2 g, Topical, QID, Mansy, Jan A, MD, 2 g at 06/02/24 4098   docusate sodium  (COLACE) capsule 200 mg, 200 mg, Oral, BID, Alphonsus Jeans, MD, 200 mg at 06/02/24 0810   enoxaparin  (LOVENOX ) injection 80 mg, 0.5 mg/kg, Subcutaneous, Q24H, Antoniette Batty T, MD, 80 mg at 06/01/24 2108   hydrochlorothiazide  (HYDRODIURIL ) tablet 12.5 mg, 12.5 mg, Oral, Daily, Antoniette Batty T, MD, 12.5 mg at 06/02/24 0810   HYDROcodone -acetaminophen  (NORCO) 10-325 MG per tablet 1 tablet, 1 tablet, Oral, Q4H PRN, Alphonsus Jeans, MD, 1 tablet at 05/31/24 2237  insulin  aspart (novoLOG ) injection 0-15 Units, 0-15 Units, Subcutaneous, TID WC, Agbata, Tochukwu, MD, 2 Units at 06/02/24 0811   lactulose  (CHRONULAC ) 10 GM/15ML solution 30 g, 30 g, Oral, BID, Alphonsus Jeans, MD, 30 g at 06/01/24 1478   lidocaine  (LMX) 4 % cream, , Topical, TID, Emeka Lindner L, MD, Given at 06/01/24 2109   lisinopril  (ZESTRIL ) tablet 20 mg, 20 mg, Oral, Daily, Antoniette Batty T, MD, 20 mg  at 06/02/24 0810   morphine  (PF) 2 MG/ML injection 2 mg, 2 mg, Intravenous, Q4H PRN, Alphonsus Jeans, MD, 2 mg at 05/28/24 1827   naproxen (NAPROSYN) tablet 500 mg, 500 mg, Oral, BID WC, Agbata, Tochukwu, MD, 500 mg at 06/02/24 2956   ondansetron  (ZOFRAN ) tablet 4 mg, 4 mg, Oral, Q6H PRN **OR** ondansetron  (ZOFRAN ) injection 4 mg, 4 mg, Intravenous, Q6H PRN, [place], MD   Oral care mouth rinse, 15 mL, Mouth Rinse, PRN, Agbata, Tochukwu, MD   polyethylene glycol (MIRALAX  / GLYCOLAX ) packet 17 g, 17 g, Oral, Daily, Alphonsus Jeans, MD, 17 g at 06/01/24 2130   thiamine  (VITAMIN B1) tablet 100 mg, 100 mg, Oral, Daily, Muzammil Bruins L, MD, 100 mg at 06/02/24 0810   Vitamin D  (Ergocalciferol ) (DRISDOL ) 1.25 MG (50000 UNIT) capsule 50,000 Units, 50,000 Units, Oral, Q7 days, Bao Bazen L, MD, 50,000 Units at 05/31/24 2237  Labs and Diagnostic Imaging   - Myelopathy labs: zinc 60 (normal), copper 175 (mildly elevated), B12 (670), MMA (pending), folate (13.4), homocysteine (13.2), Vitamin E (pending), RPR (-) - Neuropathy labs: B1 (pending), Vit D (low at 18.12), TSH (2.080), ANA (neg), ESR (82 increased from 27 four years ago), CRP (14.5), SPEP&IFE w/ QIG (pending), UPEP (not yet collected, reached out to nursing to collect), HIV (neg), Hepatitis panel (neg) - Immunology labs  RF (pending), CCP (pending)   Basic Metabolic Panel: Recent Labs  Lab 05/27/24 0415 05/28/24 0309 05/29/24 0157  NA 132* 133* 131*  K 4.0 3.8 4.3  CL 96* 96* 96*  CO2 25 25 25   GLUCOSE 138* 186* 259*  BUN 24* 32* 36*  CREATININE 0.72 0.86 0.79  CALCIUM 9.3 9.2 9.4    CBC: Recent Labs  Lab 05/29/24 0157  WBC 12.8*  HGB 12.7*  HCT 38.8*  MCV 93.7  PLT 454*    HgbA1c:  Lab Results  Component Value Date   HGBA1C 5.8 (H) 05/29/2024   MR lumbar spine: 1. 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 2. Moderate to severe neural foraminal  stenosis at L3-4 and moderate foraminal stenosis at L4-5 and L5-S1.    MRI C-spine(Personally reviewed): 1. At C4-C5, moderate to severe right and moderate left foraminal stenosis. At C6-C7, mild to moderate right foraminal stenosis. 2. No significant canal stenosis. On MEDIC sequences as documented in neurosurgery note, there are some areas of signal abnormality but these are patchy and inconsistent at different cuts and do not have a clear correlate on sagittal imaging with some limitation from motion   Knee x-rays 1. Bilateral 3 compartmental osteoarthritis and moderate knee effusions. 2. No acute displaced fractures. 3. Diffuse subcutaneous edema bilaterally.   Foot x-rays: 1. No acute displaced fractures within either ankle. 2. Symmetrical osteoarthritis of the bilateral ankles, hind feet, and mid feet. 3. Pes planus deformity bilaterally. 4. Extensive bilateral soft tissue edema.  Left wrist x-ray 1. Abnormal widening of the scapholunate interval up to 6 mm, compatible with scapholunate ligament tear. 2.  Minimal degenerative spurring at the triscaphe joint and thumb carpometacarpal joint.   Right wrist x-ray         1. Abnormal widening of the scapholunate interval measuring 9 mm. Mild proximal migration of the capitate. This is consistent with scapholunate advanced collapse (SLAC wrist). 2. Mild radioscaphoid and triscaphe osteoarthritis. 3. Mild to moderate distal radioulnar osteoarthritis.  Assessment   Charles Park is a right handed 58 y.o. male with a past medical history of arthritis and alcohol abuse presenting with pain and weakness.    Suspect vitamin deficiencies may be playing a significant role especially given improvement reported both in PT notes and by patient and history of heavy alcohol use and similar episode resolving with alcohol cessation; vitamin levels have been reassuring but after several days of eating well in the hospital.   Do not think  there is a primary neuroinflammatory condition such as transverse myelitis (only treated with pred 50 mg daily for the past few days). Therefore do not feel need for LP at this time or further imaging of thoracic spine.   A few serological tests still pending as above but largely negative workup other than elevated ESR/CRP, which are nonspecific. With reports of symptoms improving over the day typically, inflammatory arthritis is a consideration (though can also be seen with advanced osteoarthritis). 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 This condition may also explain elevated inflammatory markers  Recommendations  # Bilateral LE > UE pain/weakness  - Empiric 1000 mcg B12 daily would continue due to low risk/high benefit in patient with chronic alcohol use - Empiric 100 mg thiamine  daily would continue due to low risk/high benefit in patient with chronic alcohol use - Lidocaine  gel to the backs of the knees, patient has been using on hands too, counseled on being careful to avoid spread to eyes  # Low Vitamin D  - Supplementation ordered, recheck and monitoring per PCP   # Scapholunate advanced collapse  # Concern for pseudogout # Elevated ESR/CRP  - Additional treatment / management per primary team - Appreciate addition of NSAIDS, consider escalation to ketorolac if needed - Outpatient orthopedic surgery and/or rheumatology follow-up   Neurology will sign off as the remainder of this workup will take time to result and can be followed up by primary team / PCP / outpatient providers. Discussed with Dr. Meyer Ada via secure  chat ______________________________________________________________________  Baldwin Levee MD-PhD [facility] [phone] [facility] coverage for [facility] is from 8 AM to 4 AM in-house and 4 PM to 8 PM by telephone/video. 8 PM to 8 AM emergent questions or overnight urgent questions should be addressed to Teleneurology On-call or Arlin Benes neurohospitalist; contact information can be found on [employer]ION

## 2024-06-02 NOTE — Progress Notes (Signed)
 Progress Note   Patient: Charles Park WUJ:[id number] DOB: 1966-04-03 DOA: 05/22/2024     11 DOS: the patient was seen and examined on 06/02/2024   Brief hospital course:  Charles Park is a 58 y.o. male with hx of arthritis and chronic alcohol use presenting with pain and difficulty ambulating for which neurology is aksed to evaluate.    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 He experienced a painful right foot, which he could not put weight on, and noticed soreness in "the pivot muscle" on the top of the foot. The pain improved slightly after resting. This same day he also noticed difficulty reaching up to lift boxes onto high shelves   An episode occurred where he was mowing the lawn about five days after the initial foot pain. He felt his shoe twist, which did not bother him at the time, but later led to increased pain, prompting the use of crutches.    He reports difficulty with fine motor tasks such as buttoning buttons and snapping fingers, ongoing for about a year, slowly progressive. Some improvement in these symptoms has been noted since being hospitalized. He also experienced muscle spasms in the hips and hamstring area before hospitalization, which have since improved.   Regarding proximal upper extremity weakness, he reports when he started his current job 1 year ago he did experienced difficulty lifting boxes onto shelves, but felt his shoulders became stronger over time, until the week prior to admission   Generally symptoms improve over the course of the day   No numbness, vision changes, hearing difficulties, or speech and swallowing issues. No chest pain, shortness of breath, nausea, vomiting, diarrhea, or urinary issues. He has experienced muscle cramps, attributed to possible magnesium deficiency, but these have improved since hospitalization.   He has a history of heavy alcohol consumption,  drinking approximately twelve beers a day, but stopped two weeks ago following the onset of his symptoms. He has not resumed drinking since hospitalization and reports a poor diet as a single individual.    Of note, had a previous episode five years ago when he experienced similar symptoms and was unable to walk, which resolved after he stopped drinking (was drinking heavily at the time, stopped and subsequently restarted).   In the hospital, he was treated for 7 days with cefazolin  which lead to improvement in his rash (felt to be cellulitis)  He was also started on prednisone  50 mg daily on 6/3    Assessment and Plan:  Acute ambulatory impairment:  Etiology unclear, possibly secondary to multi-joint OA, foraminal stenosis., ?? Inflammatory arthritis US  of b/l LE neg for DVT.  Still not able to ambulate w/ therapy or independently.  MRI of the cervical spine showed  C4-C5, moderate to severe right and moderate left foraminal stenosis. C6-C7, mild to moderate right foraminal stenosis. No significant canal stenosis. Neurosurgery consult placed.  Input appreciated Appreciate neurology input, myelopathy/neuropathic workup mostly negative except for elevated CRP and ESR .  Normal folate and B12 levels.  RPR nonreactive, HIV nonreactive and hepatitis panel negative. PT/OT recs SNF but pt does not have any insurance. Medicaid application is pending as per CM. Neuro surg recommended conservative management.  Continue empiric therapy with thiamine  and vitamin B12 as well as NSAIDS       Bilateral lower extremity cellulitis: w/ edema.  Completed 5 day course of IV ancef .      B/l hand &  wrist pain & weakness:  Etiology unclear. X-ray of the cervical spine shows mild-to-moderate C4-5 and C6-7 degenerative disc and endplate changes. Mild bilateral C4-5 neuroforaminal narrowing. Mild retrolisthesis of C4 on C5 and mild grade 1 anterolisthesis of C5 on C6. DDX, gout vs pseudogout  Completed a 5 day  course of prednisone   Trial of NSAIDs     Morbid obesity: BMI 43.7.  Complicates overall care & prognosis      Hyponatremia:  Secondary to hydrochlorothiazide  use Monitor closely   Constipation:  Continue on colace, miralax  & lactulose       Hyperglycemia Related to systemic steroid therapy No known history of diabetes Hemoglobin A1c is 5.8 Check blood sugars with meals Sliding scale insulin  coverage          Subjective: No new complaints  Physical Exam: Vitals:   06/01/24 0839 06/01/24 1334 06/01/24 2053 06/02/24 0721  BP:  (!) 149/78 139/80 127/82  Pulse: 80 88 88 88  Resp:  18 19 18   Temp:  (!) 97.5 F (36.4 C) 98.3 F (36.8 C) 98.2 F (36.8 C)  TempSrc:  Oral  Oral  SpO2:  96% 99% 98%  Weight:      Height:         General exam: appears calm & comfortable. Morbidly obese Respiratory system: Bilateral air entry Cardiovascular system: S1/S2+. No rubs or clicks  Gastrointestinal system: abd is soft, NT, obese & bowel sounds are present Central nervous system: Alert & awake. Moves all extremities  Psychiatry: judgement and insight appears at baseline. Flat mood and affect          Data Reviewed:  Labs reviewed  Family Communication: Plan of care was discussed with patient in detail. He verbalizes understanding and agrees with the plan  Disposition: Status is: Inpatient Remains inpatient appropriate because: Awaiting discharge to SNF  Planned Discharge Destination: Skilled nursing facility    Time spent: 33 minutes  Author: Read Camel, MD 06/02/2024 1:28 PM  For on call review [email or web address].

## 2024-06-02 NOTE — Progress Notes (Signed)
 24 hour urine collection started at 1150 on June 02, 2024. This is kept on ice during the collection. Basin with ice is placed in the patient's bathroom.

## 2024-06-02 NOTE — Plan of Care (Signed)
  Problem: Clinical Measurements: Goal: Diagnostic test results will improve Outcome: Progressing   Problem: Elimination: Goal: Will not experience complications related to urinary retention Outcome: Progressing   Problem: Safety: Goal: Ability to remain free from injury will improve Outcome: Progressing

## 2024-06-03 DIAGNOSIS — E66813 Obesity, class 3: Secondary | ICD-10-CM | POA: Diagnosis present

## 2024-06-03 DIAGNOSIS — E871 Hypo-osmolality and hyponatremia: Secondary | ICD-10-CM | POA: Diagnosis present

## 2024-06-03 DIAGNOSIS — I10 Essential (primary) hypertension: Secondary | ICD-10-CM | POA: Diagnosis present

## 2024-06-03 DIAGNOSIS — M1990 Unspecified osteoarthritis, unspecified site: Secondary | ICD-10-CM | POA: Diagnosis present

## 2024-06-03 LAB — MULTIPLE MYELOMA PANEL, SERUM
Albumin SerPl Elph-Mcnc: 2.2 g/dL — ABNORMAL LOW (ref 2.9–4.4)
Albumin/Glob SerPl: 0.6 — ABNORMAL LOW (ref 0.7–1.7)
Alpha 1: 0.5 g/dL — ABNORMAL HIGH (ref 0.0–0.4)
Alpha2 Glob SerPl Elph-Mcnc: 1.1 g/dL — ABNORMAL HIGH (ref 0.4–1.0)
B-Globulin SerPl Elph-Mcnc: 1.4 g/dL — ABNORMAL HIGH (ref 0.7–1.3)
Gamma Glob SerPl Elph-Mcnc: 1.4 g/dL (ref 0.4–1.8)
Globulin, Total: 4.3 g/dL — ABNORMAL HIGH (ref 2.2–3.9)
IgA: 460 mg/dL — ABNORMAL HIGH (ref 90–386)
IgG (Immunoglobin G), Serum: 1697 mg/dL — ABNORMAL HIGH (ref 603–1613)
IgM (Immunoglobulin M), Srm: 32 mg/dL (ref 20–172)
Total Protein ELP: 6.5 g/dL (ref 6.0–8.5)

## 2024-06-03 LAB — VITAMIN E
Vitamin E (Alpha Tocopherol): 13.4 mg/L (ref 7.0–25.1)
Vitamin E(Gamma Tocopherol): 1.1 mg/L (ref 0.5–5.5)

## 2024-06-03 LAB — VITAMIN B1: Vitamin B1 (Thiamine): 120.2 nmol/L (ref 66.5–200.0)

## 2024-06-03 LAB — METHYLMALONIC ACID, SERUM: Methylmalonic Acid, Quantitative: 331 nmol/L (ref 0–378)

## 2024-06-03 LAB — GLUCOSE, CAPILLARY
Glucose-Capillary: 190 mg/dL — ABNORMAL HIGH (ref 70–99)
Glucose-Capillary: 139 mg/dL — ABNORMAL HIGH (ref 70–99)

## 2024-06-03 LAB — CYCLIC CITRUL PEPTIDE ANTIBODY, IGG/IGA: CCP Antibodies IgG/IgA: 8 U (ref 0–19)

## 2024-06-03 MED ORDER — DICLOFENAC SODIUM 1 % EX GEL: 2.0000 g | Freq: Four times a day (QID) | CUTANEOUS | 1 refills | Status: AC

## 2024-06-03 MED ORDER — LISINOPRIL 20 MG PO TABS: 20.0000 mg | ORAL_TABLET | Freq: Every day | ORAL | 0 refills | Status: AC

## 2024-06-03 MED ORDER — HYDROCHLOROTHIAZIDE 12.5 MG PO TABS: 12.5000 mg | ORAL_TABLET | Freq: Every day | ORAL | 0 refills | Status: AC

## 2024-06-03 MED ORDER — POLYETHYLENE GLYCOL 3350 17 G PO PACK: 17.0000 g | PACK | Freq: Every day | ORAL | 0 refills | Status: AC

## 2024-06-03 MED ORDER — CYANOCOBALAMIN 1000 MCG PO TABS: 1000.0000 ug | ORAL_TABLET | Freq: Every day | ORAL | 0 refills | Status: AC

## 2024-06-03 MED ORDER — VITAMIN B-1 100 MG PO TABS: 100.0000 mg | ORAL_TABLET | Freq: Every day | ORAL | 0 refills | Status: AC

## 2024-06-03 MED ORDER — PANTOPRAZOLE SODIUM 40 MG PO TBEC: 40.0000 mg | DELAYED_RELEASE_TABLET | Freq: Every day | ORAL | 1 refills | Status: AC

## 2024-06-03 MED ORDER — NAPROXEN 500 MG PO TABS: 500.0000 mg | ORAL_TABLET | Freq: Two times a day (BID) | ORAL | 0 refills | Status: AC

## 2024-06-03 MED ORDER — ACETAMINOPHEN 325 MG PO TABS: 650.0000 mg | ORAL_TABLET | Freq: Four times a day (QID) | ORAL | 0 refills | Status: AC | PRN

## 2024-06-03 MED ORDER — VITAMIN D (ERGOCALCIFEROL) 1.25 MG (50000 UNIT) PO CAPS: 50000.0000 [IU] | ORAL_CAPSULE | ORAL | 0 refills | Status: AC

## 2024-06-03 NOTE — Progress Notes (Signed)
 24 hour urine sent to lab

## 2024-06-03 NOTE — TOC Transition Note (Signed)
 Transition of Care [facility]) - Discharge Note   Patient Details  Name: Charles Park MRN: [id number] Date of Birth: [birth year]  Transition of Care [facility]) CM/SW Contact:  Alexandra Ice, RN Phone Number: 06/03/2024, 11:46 AM   Clinical Narrative:     [facility] is able to accept patient today. Patient going to room E-13, nurse to call report to [phone].   Discharge summary and orders sent to facility via HUB. Contacted [facility] spoke with ED, patient is scheduled for pick up between 1:00-1:30pm. EMS packet printed to nurse station. Notified bedside nurse and MD.   Final next level of care: Skilled Nursing Facility Barriers to Discharge: Barriers Resolved   Patient Goals and CMS Choice Patient states their goals for this hospitalization and ongoing recovery are:: get better          Discharge Placement              Patient chooses bed at: Other - please specify in the comment section below: ([facility]) Patient to be transferred to facility by: [facility] Name of family member notified: patient Patient and family notified of of transfer: 06/03/24  Discharge Plan and Services Additional resources added to the After Visit Summary for                    DME Agency: NA       HH Arranged: NA          Social Drivers of Health (SDOH) Interventions SDOH Screenings   Food Insecurity: Food Insecurity Present (05/22/2024)  Housing: Low Risk  (05/22/2024)  Transportation Needs: No Transportation Needs (05/22/2024)  Utilities: At Risk (05/22/2024)  Tobacco Use: Medium Risk (05/22/2024)     Readmission Risk Interventions     No data to display

## 2024-06-03 NOTE — Plan of Care (Signed)
  Problem: Education: Goal: Knowledge of General Education information will improve Description: Including pain rating scale, medication(s)/side effects and non-pharmacologic comfort measures Outcome: Adequate for Discharge   Problem: Health Behavior/Discharge Planning: Goal: Ability to manage health-related needs will improve Outcome: Adequate for Discharge   Problem: Clinical Measurements: Goal: Ability to maintain clinical measurements within normal limits will improve Outcome: Adequate for Discharge Goal: Will remain free from infection Outcome: Adequate for Discharge Goal: Diagnostic test results will improve Outcome: Adequate for Discharge Goal: Respiratory complications will improve Outcome: Adequate for Discharge Goal: Cardiovascular complication will be avoided Outcome: Adequate for Discharge   Problem: Activity: Goal: Risk for activity intolerance will decrease Outcome: Adequate for Discharge   Problem: Nutrition: Goal: Adequate nutrition will be maintained Outcome: Adequate for Discharge   Problem: Coping: Goal: Level of anxiety will decrease Outcome: Adequate for Discharge   Problem: Elimination: Goal: Will not experience complications related to bowel motility Outcome: Adequate for Discharge Goal: Will not experience complications related to urinary retention Outcome: Adequate for Discharge   Problem: Pain Managment: Goal: General experience of comfort will improve and/or be controlled Outcome: Adequate for Discharge   Problem: Safety: Goal: Ability to remain free from injury will improve Outcome: Adequate for Discharge   Problem: Skin Integrity: Goal: Risk for impaired skin integrity will decrease Outcome: Adequate for Discharge   Problem: Clinical Measurements: Goal: Ability to avoid or minimize complications of infection will improve Outcome: Adequate for Discharge   Problem: Skin Integrity: Goal: Skin integrity will improve Outcome: Adequate  for Discharge   Problem: Clinical Measurements: Goal: Ability to avoid or minimize complications of infection will improve Outcome: Adequate for Discharge   Problem: Skin Integrity: Goal: Skin integrity will improve Outcome: Adequate for Discharge   Problem: Education: Goal: Ability to describe self-care measures that may prevent or decrease complications (Diabetes Survival Skills Education) will improve Outcome: Adequate for Discharge Goal: Individualized Educational Video(s) Outcome: Adequate for Discharge   Problem: Coping: Goal: Ability to adjust to condition or change in health will improve Outcome: Adequate for Discharge   Problem: Fluid Volume: Goal: Ability to maintain a balanced intake and output will improve Outcome: Adequate for Discharge   Problem: Health Behavior/Discharge Planning: Goal: Ability to identify and utilize available resources and services will improve Outcome: Adequate for Discharge Goal: Ability to manage health-related needs will improve Outcome: Adequate for Discharge   Problem: Metabolic: Goal: Ability to maintain appropriate glucose levels will improve Outcome: Adequate for Discharge   Problem: Nutritional: Goal: Maintenance of adequate nutrition will improve Outcome: Adequate for Discharge Goal: Progress toward achieving an optimal weight will improve Outcome: Adequate for Discharge   Problem: Skin Integrity: Goal: Risk for impaired skin integrity will decrease Outcome: Adequate for Discharge   Problem: Tissue Perfusion: Goal: Adequacy of tissue perfusion will improve Outcome: Adequate for Discharge

## 2024-06-03 NOTE — Discharge Summary (Signed)
 Physician Discharge Summary   Patient: Charles Park MRN: [id number] DOB: October 18, 1966  Admit date:     05/22/2024  Discharge date: 06/03/24  Discharge Physician: Verdun Rackley   PCP: Patient, No Pcp Per   Recommendations at discharge:   Take medications as recommended  Discharge Diagnoses: Principal Problem:   Impaired ambulation Active Problems:   Inflammatory arthritis   Obesity, Class III, BMI 40-49.9 (morbid obesity)   Essential hypertension   Hyponatremia  Resolved Problems:   * No resolved hospital problems. *  Hospital Course:  Charles Park is a 58 y.o. male with hx of arthritis and chronic alcohol use presenting with pain and difficulty ambulating for which neurology is aksed to evaluate.    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 He experienced a painful right foot, which he could not put weight on, and noticed soreness in "the pivot muscle" on the top of the foot. The pain improved slightly after resting. This same day he also noticed difficulty reaching up to lift boxes onto high shelves   An episode occurred where he was mowing the lawn about five days after the initial foot pain. He felt his shoe twist, which did not bother him at the time, but later led to increased pain, prompting the use of crutches.    He reports difficulty with fine motor tasks such as buttoning buttons and snapping fingers, ongoing for about a year, slowly progressive. Some improvement in these symptoms has been noted since being hospitalized. He also experienced muscle spasms in the hips and hamstring area before hospitalization, which have since improved.   Regarding proximal upper extremity weakness, he reports when he started his current job 1 year ago he did experienced difficulty lifting boxes onto shelves, but felt his shoulders became stronger over time, until the week prior to admission   Generally  symptoms improve over the course of the day   No numbness, vision changes, hearing difficulties, or speech and swallowing issues. No chest pain, shortness of breath, nausea, vomiting, diarrhea, or urinary issues. He has experienced muscle cramps, attributed to possible magnesium deficiency, but these have improved since hospitalization.   He has a history of heavy alcohol consumption, drinking approximately twelve beers a day, but stopped two weeks ago following the onset of his symptoms. He has not resumed drinking since hospitalization and reports a poor diet as a single individual.    Of note, had a previous episode five years ago when he experienced similar symptoms and was unable to walk, which resolved after he stopped drinking (was drinking heavily at the time, stopped and subsequently restarted).   In the hospital, he was treated for 7 days with cefazolin  which lead to improvement in his rash (felt to be cellulitis)  He was also started on prednisone  50 mg daily on 6/3       Assessment and Plan:  Acute ambulatory impairment:  Etiology unclear, possibly secondary to multi-joint OA, foraminal stenosis., ?? Inflammatory arthritis US  of b/l LE neg for DVT.  Improved pain but continues to have ambulatory difficulty with ambulation MRI of the cervical spine showed  C4-C5, moderate to severe right and moderate left foraminal stenosis. C6-C7, mild to moderate right foraminal stenosis. No significant canal stenosis. Neurosurgery consult placed.  Input appreciated Appreciate neurology input, myelopathy/neuropathic workup mostly negative except for elevated CRP and ESR .  Normal folate and B12 levels.  RPR nonreactive, HIV nonreactive and hepatitis panel  negative.  Continue empiric therapy with thiamine  and vitamin B12 as well as NSAIDS Patient will be discharged to SNF       Bilateral lower extremity cellulitis: w/ edema.  Completed 5 day course of IV ancef .      B/l hand & wrist pain  & weakness:  Etiology unclear. X-ray of the cervical spine shows mild-to-moderate C4-5 and C6-7 degenerative disc and endplate changes. Mild bilateral C4-5 neuroforaminal narrowing. Mild retrolisthesis of C4 on C5 and mild grade 1 anterolisthesis of C5 on C6. DDX, gout vs pseudogout  Completed a 5 day course of prednisone   Trial of NSAIDs Appreciate neurology and neurosurgery input     Morbid obesity: BMI 43.7.  Complicates overall care & prognosis      Hyponatremia:  Secondary to hydrochlorothiazide  use Monitor closely   Constipation:  Continue on colace, miralax  & lactulose       Hyperglycemia Resolved Related to systemic steroid therapy No known history of diabetes Hemoglobin A1c is 5.8 Check blood sugars with meals Sliding scale insulin  coverage        Consultants: Neurology, neurosurgery Procedures performed: None  Disposition: Skilled nursing facility Diet recommendation:  Discharge Diet Orders (From admission, onward)     Start     Ordered   06/03/24 0000  Diet - low sodium heart healthy        06/03/24 1120           Cardiac diet DISCHARGE MEDICATION: Allergies as of 06/03/2024   No Known Allergies      Medication List     TAKE these medications    acetaminophen  325 MG tablet Commonly known as: TYLENOL  Take 2 tablets (650 mg total) by mouth every 6 (six) hours as needed for mild pain (pain score 1-3), fever or headache.   cyanocobalamin  1000 MCG tablet Take 1 tablet (1,000 mcg total) by mouth daily. Start taking on: June 04, 2024   diclofenac  Sodium 1 % Gel Commonly known as: VOLTAREN  Apply 2 g topically 4 (four) times daily.   hydrochlorothiazide  12.5 MG tablet Commonly known as: HYDRODIURIL  Take 1 tablet (12.5 mg total) by mouth daily. Start taking on: June 04, 2024   lisinopril  20 MG tablet Commonly known as: ZESTRIL  Take 1 tablet (20 mg total) by mouth daily. Start taking on: June 04, 2024   naproxen 500 MG tablet Commonly  known as: NAPROSYN Take 1 tablet (500 mg total) by mouth 2 (two) times daily with a meal for 10 days.   pantoprazole 40 MG tablet Commonly known as: Protonix Take 1 tablet (40 mg total) by mouth daily.   polyethylene glycol 17 g packet Commonly known as: MIRALAX  / GLYCOLAX  Take 17 g by mouth daily. Start taking on: June 04, 2024   thiamine  100 MG tablet Commonly known as: Vitamin B-1 Take 1 tablet (100 mg total) by mouth daily. Start taking on: June 04, 2024   Vitamin D  (Ergocalciferol ) 1.25 MG (50000 UNIT) Caps capsule Commonly known as: DRISDOL  Take 1 capsule (50,000 Units total) by mouth every 7 (seven) days. Start taking on: June 07, 2024        Discharge Exam: Cleavon Curls Weights   05/22/24 0727  Weight: (!) 158.8 kg   General exam: appears calm & comfortable. Morbidly obese Respiratory system: Bilateral air entry Cardiovascular system: S1/S2+. No rubs or clicks  Gastrointestinal system: abd is soft, NT, obese & bowel sounds are present Central nervous system: Alert & awake. Moves all extremities  Psychiatry: judgement and insight appears at  baseline. Flat mood and affect    Condition at discharge: Stable  The results of significant diagnostics from this hospitalization (including imaging, microbiology, ancillary and laboratory) are listed below for reference.   Imaging Studies: MR CERVICAL SPINE WO CONTRAST Result Date: 05/30/2024 CLINICAL DATA:  Cervical radiculopathy, no red flags. EXAM: MRI CERVICAL SPINE WITHOUT CONTRAST TECHNIQUE: Multiplanar, multisequence MR imaging of the cervical spine was performed. No intravenous contrast was administered. COMPARISON:  None Available. FINDINGS: Alignment: No substantial sagittal subluxation. Vertebrae: No evidence of acute fracture, suspicious bone lesion or discitis/osteomyelitis. Cord: Normal cord signal. Posterior Fossa, vertebral arteries, paraspinal tissues: Visualized vertebral artery flow voids maintained. No evidence of  acute abnormality in the partially visualized posterior fossa. Disc levels: Motion limited evaluation.  Within this limitation: C2-C3: Posterior disc osteophyte complex without significant canal or foraminal stenosis. C3-C4: Bilateral facet and uncovertebral hypertrophy and small posterior disc osteophyte complex. No significant canal or foraminal stenosis. C4-C5: Posterior disc osteophyte complex with right greater than left facet and uncovertebral hypertrophy. Resulting moderate to severe right and moderate left foraminal stenosis. Patent canal. C5-C6: Right greater than left facet and uncovertebral hypertrophy with mild left foraminal stenosis. Patent canal and right foramen. C6-C7: Bilateral facet and uncovertebral hypertrophy with mild-to-moderate right foraminal stenosis. Patent canal. C7-T1: Facet arthropathy without significant stenosis. IMPRESSION: 1. At C4-C5, moderate to severe right and moderate left foraminal stenosis. At C6-C7, mild to moderate right foraminal stenosis. 2. No significant canal stenosis. Electronically Signed   By: Stevenson Elbe M.D.   On: 05/30/2024 03:32   DG Wrist Complete Left Result Date: 05/28/2024 CLINICAL DATA:  Neck pain and wrist pain and weakness. EXAM: LEFT WRIST - COMPLETE 3+ VIEW COMPARISON:  None Available. FINDINGS: 1.5 mm ulnar negative variance. Abnormal widening of the scapholunate interval up to 6 mm. Minimal peripheral spurring at the triscaphe joint. Mild distal radioulnar peripheral degenerative osteophytosis. Minimal degenerative spurring at the lateral aspect of the thumb carpometacarpal joint with preserved joint space. No acute fracture or dislocation. IMPRESSION: 1. Abnormal widening of the scapholunate interval up to 6 mm, compatible with scapholunate ligament tear. 2. Minimal degenerative spurring at the triscaphe joint and thumb carpometacarpal joint. Electronically Signed   By: Bertina Broccoli M.D.   On: 05/28/2024 17:15   DG Cervical Spine  Complete Result Date: 05/28/2024 CLINICAL DATA:  Neck pain. EXAM: CERVICAL SPINE - COMPLETE 4+ VIEW COMPARISON:  None Available. FINDINGS: The atlantodens interval is intact. Vertebral body heights are maintained. 2 mm retrolisthesis of C4 on C5 and 2 mm grade 1 anterolisthesis of C5 on C6. Mild to moderate C4-5 and C6-7 disc space narrowing and endplate sclerosis. Moderate anterior C4-5 and mild-to-moderate anterior C6-7 endplate osteophytes. Apparent mild bilateral C4-5 neuroforaminal narrowing on limited oblique views. The lateral masses of C1 are symmetrically aligned with the dens on open mouth odontoid view. No prevertebral soft tissue swelling. IMPRESSION: 1. Mild-to-moderate C4-5 and C6-7 degenerative disc and endplate changes. 2. Mild bilateral C4-5 neuroforaminal narrowing. 3. Mild retrolisthesis of C4 on C5 and mild grade 1 anterolisthesis of C5 on C6. Electronically Signed   By: Bertina Broccoli M.D.   On: 05/28/2024 17:13   DG Wrist Complete Right Result Date: 05/28/2024 CLINICAL DATA:  Bilateral wrist pain. EXAM: RIGHT WRIST - COMPLETE 3+ VIEW COMPARISON:  None Available. FINDINGS: 1.5 mm ulnar negative variance. Mild to moderate distal radioulnar peripheral osteophytosis. There is abnormal widening of the scapholunate interval measuring 9 mm. Mild proximal migration of the capitate. Mild radioscaphoid joint  space narrowing. Mild joint space narrowing and peripheral spurring at the medial aspect of the triscaphe joint. Mild lunate-triquetral degenerative subchondral cystic change. No acute fracture is seen.  No dislocation. IMPRESSION: 1. Abnormal widening of the scapholunate interval measuring 9 mm. Mild proximal migration of the capitate. This is consistent with scapholunate advanced collapse (SLAC wrist). 2. Mild radioscaphoid and triscaphe osteoarthritis. 3. Mild to moderate distal radioulnar osteoarthritis. Electronically Signed   By: Bertina Broccoli M.D.   On: 05/28/2024 17:08   MR Lumbar Spine W  Wo Contrast Result Date: 05/22/2024 CLINICAL DATA:  Myelopathy, acute, lumbar spine. Bilateral proximal lower extremity weakness. EXAM: MRI LUMBAR SPINE WITHOUT AND WITH CONTRAST TECHNIQUE: Multiplanar and multiecho pulse sequences of the lumbar spine were obtained without and with intravenous contrast. CONTRAST:  10mL GADAVIST  GADOBUTROL  1 MMOL/ML IV SOLN COMPARISON:  None Available. FINDINGS: Segmentation:  Standard. Alignment:  Normal. Vertebrae: No fracture, suspicious marrow lesion, or significant marrow edema. Moderate Modic type 2 endplate changes at L5-S1. Conus medullaris and cauda equina: Conus extends to the L1-2 level. Conus and cauda equina appear normal. Paraspinal and other soft tissues: Unremarkable. Disc levels: T12-L1: Mild facet hypertrophy without disc herniation or stenosis. L1-2: Moderate facet hypertrophy without disc herniation or stenosis. L2-3: Minimal disc bulging and moderate facet and ligamentum flavum hypertrophy without stenosis. L3-4: Disc desiccation and mild disc space narrowing. Disc bulging and severe facet and ligamentum flavum hypertrophy result in mild spinal stenosis and moderate right and moderate to severe left neural foraminal stenosis. L4-5: Disc desiccation and mild disc space narrowing. Disc bulging, a central disc protrusion with annular fissure, and mild-to-moderate facet and ligamentum flavum hypertrophy result in mild spinal stenosis and moderate bilateral neural foraminal stenosis. L5-S1: Disc desiccation and moderate disc space narrowing. Circumferential disc bulging and mild-to-moderate facet hypertrophy result in moderate bilateral neural foraminal stenosis without spinal stenosis. IMPRESSION: 1. 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 2. Moderate to severe neural foraminal stenosis at L3-4 and moderate foraminal stenosis at L4-5 and L5-S1. Electronically Signed   By: Aundra Lee M.D.   On: 05/22/2024 15:14    US  Venous Img Lower Bilateral Result Date: 05/22/2024 CLINICAL DATA:  Lower extremity swelling for 4 days EXAM: Bilateral Lower Extremity Venous Doppler Ultrasound TECHNIQUE: Gray-scale sonography with compression, as well as color and duplex ultrasound, were performed to evaluate the deep venous system(s) from the level of the common femoral vein through the popliteal and proximal calf veins. COMPARISON:  None available FINDINGS: VENOUS Normal compressibility of the common femoral, superficial femoral, and popliteal veins, as well as the visualized calf veins. Visualized portions of profunda femoral vein and great saphenous vein unremarkable. No filling defects to suggest DVT on grayscale or color Doppler imaging. Doppler waveforms show normal direction of venous flow, normal respiratory plasticity and response to augmentation. OTHER None. Limitations: Limited visualization of the calf veins. IMPRESSION: No lower extremity DVT. Electronically Signed   By: Elester Grim M.D.   On: 05/22/2024 12:24   DG Ankle Complete Right Result Date: 05/22/2024 CLINICAL DATA:  Tripped and fell, bilateral lower extremity swelling and pain EXAM: RIGHT ANKLE - COMPLETE 3+ VIEW; LEFT ANKLE COMPLETE - 3+ VIEW COMPARISON:  None Available. FINDINGS: Left ankle: Frontal, oblique, and lateral views are obtained. There are no acute displaced fractures. Moderate osteoarthritis throughout the ankle, hindfoot, and visualized midfoot. Pes planus deformity. Diffuse subcutaneous edema. Prominent inferior calcaneal spur. Right ankle: Frontal, oblique, and lateral views are  obtained. No acute displaced fractures. Moderate to severe osteoarthritis throughout the ankle, hindfoot, and visualized midfoot. Prominent inferior calcaneal spur. Pes planus deformity. Diffuse soft tissue edema. IMPRESSION: 1. No acute displaced fractures within either ankle. 2. Symmetrical osteoarthritis of the bilateral ankles, hind feet, and mid feet. 3. Pes planus  deformity bilaterally. 4. Extensive bilateral soft tissue edema. Electronically Signed   By: Bobbye Burrow M.D.   On: 05/22/2024 08:28   DG Ankle Complete Left Result Date: 05/22/2024 CLINICAL DATA:  Tripped and fell, bilateral lower extremity swelling and pain EXAM: RIGHT ANKLE - COMPLETE 3+ VIEW; LEFT ANKLE COMPLETE - 3+ VIEW COMPARISON:  None Available. FINDINGS: Left ankle: Frontal, oblique, and lateral views are obtained. There are no acute displaced fractures. Moderate osteoarthritis throughout the ankle, hindfoot, and visualized midfoot. Pes planus deformity. Diffuse subcutaneous edema. Prominent inferior calcaneal spur. Right ankle: Frontal, oblique, and lateral views are obtained. No acute displaced fractures. Moderate to severe osteoarthritis throughout the ankle, hindfoot, and visualized midfoot. Prominent inferior calcaneal spur. Pes planus deformity. Diffuse soft tissue edema. IMPRESSION: 1. No acute displaced fractures within either ankle. 2. Symmetrical osteoarthritis of the bilateral ankles, hind feet, and mid feet. 3. Pes planus deformity bilaterally. 4. Extensive bilateral soft tissue edema. Electronically Signed   By: Bobbye Burrow M.D.   On: 05/22/2024 08:28   DG Knee Complete 4 Views Left Result Date: 05/22/2024 CLINICAL DATA:  Tripped and fell last week, bilateral leg swelling and pain EXAM: LEFT KNEE - COMPLETE 4+ VIEW; RIGHT KNEE - COMPLETE 4+ VIEW COMPARISON:  07/03/2019 FINDINGS: Left knee: Frontal, bilateral oblique, and cross-table lateral views are obtained. No acute displaced fracture, subluxation, or dislocation. There is mild 3 compartmental osteoarthritis greatest in the patellofemoral compartment. Moderate joint effusion. Diffuse subcutaneous edema. Right knee: Frontal, bilateral oblique, and cross-table lateral views are obtained. No acute fracture, subluxation, or dislocation. Mild 3 compartmental osteoarthritis greatest in the medial and patellofemoral compartments.  Moderate joint effusion. Diffuse subcutaneous edema. IMPRESSION: 1. Bilateral 3 compartmental osteoarthritis and moderate knee effusions. 2. No acute displaced fractures. 3. Diffuse subcutaneous edema bilaterally. Electronically Signed   By: Bobbye Burrow M.D.   On: 05/22/2024 08:26   DG Knee Complete 4 Views Right Result Date: 05/22/2024 CLINICAL DATA:  Tripped and fell last week, bilateral leg swelling and pain EXAM: LEFT KNEE - COMPLETE 4+ VIEW; RIGHT KNEE - COMPLETE 4+ VIEW COMPARISON:  07/03/2019 FINDINGS: Left knee: Frontal, bilateral oblique, and cross-table lateral views are obtained. No acute displaced fracture, subluxation, or dislocation. There is mild 3 compartmental osteoarthritis greatest in the patellofemoral compartment. Moderate joint effusion. Diffuse subcutaneous edema. Right knee: Frontal, bilateral oblique, and cross-table lateral views are obtained. No acute fracture, subluxation, or dislocation. Mild 3 compartmental osteoarthritis greatest in the medial and patellofemoral compartments. Moderate joint effusion. Diffuse subcutaneous edema. IMPRESSION: 1. Bilateral 3 compartmental osteoarthritis and moderate knee effusions. 2. No acute displaced fractures. 3. Diffuse subcutaneous edema bilaterally. Electronically Signed   By: Bobbye Burrow M.D.   On: 05/22/2024 08:26   DG Hip Unilat W or Wo Pelvis 2-3 Views Right Result Date: 05/22/2024 CLINICAL DATA:  Marvell Slider, pain EXAM: DG HIP (WITH OR WITHOUT PELVIS) 2-3V RIGHT COMPARISON:  None Available. FINDINGS: Frontal view of the pelvis as well as frontal and frogleg lateral views of the right hip are obtained. Assessment is slightly limited by patient body habitus. No evidence of acute fracture, subluxation, or dislocation. Mild symmetrical bilateral hip osteoarthritis. Sacroiliac joints are unremarkable. Soft tissues appear normal. IMPRESSION:  1. No acute displaced fracture. 2. Mild symmetrical bilateral hip osteoarthritis. Electronically Signed    By: Bobbye Burrow M.D.   On: 05/22/2024 08:25    Microbiology: Results for orders placed or performed during the hospital encounter of 05/22/24  Group A Strep by PCR     Status: None   Collection Time: 05/22/24  4:24 PM   Specimen: Throat; Sterile Swab  Result Value Ref Range Status   Group A Strep by PCR NOT DETECTED NOT DETECTED Final    Comment: Performed at [facility], [address] [place], [place] [address]    Labs: CBC: Recent Labs  Lab 05/29/24 0157  WBC 12.8*  HGB 12.7*  HCT 38.8*  MCV 93.7  PLT 454*   Basic Metabolic Panel: Recent Labs  Lab 05/28/24 0309 05/29/24 0157  NA 133* 131*  K 3.8 4.3  CL 96* 96*  CO2 25 25  GLUCOSE 186* 259*  BUN 32* 36*  CREATININE 0.86 0.79  CALCIUM 9.2 9.4   Liver Function Tests: Recent Labs  Lab 05/29/24 0157  AST 43*  ALT 42  ALKPHOS 158*  BILITOT 0.9  PROT 7.6  ALBUMIN 2.2*   CBG: Recent Labs  Lab 06/02/24 0716 06/02/24 1154 06/02/24 1637 06/02/24 2151 06/03/24 0841  GLUCAP 150* 143* 210* 199* 190*    Discharge time spent: greater than 30 minutes.  Signed: Read Camel, MD [facility] 06/03/2024

## 2024-06-03 NOTE — Plan of Care (Signed)
   Problem: Education: Goal: Knowledge of General Education information will improve Description: Including pain rating scale, medication(s)/side effects and non-pharmacologic comfort measures Outcome: Progressing   Problem: Nutrition: Goal: Adequate nutrition will be maintained Outcome: Progressing   Problem: Coping: Goal: Level of anxiety will decrease Outcome: Progressing

## 2024-06-04 LAB — UPEP/TP, 24-HR URINE
Albumin, U: 18.6 %
Alpha 1, Urine: 3.9 %
Alpha 2, Urine: 15.8 %
Beta, Urine: 28.9 %
Gamma Globulin, Urine: 32.8 %
Total Protein, Urine-Ur/day: 122 mg/(24.h) (ref 30–150)
Total Protein, Urine: 10.6 mg/dL
Total Volume: 1150

## 2024-12-10 ENCOUNTER — Other Ambulatory Visit: Payer: Self-pay | Admitting: Internal Medicine

## 2024-12-10 DIAGNOSIS — R079 Chest pain, unspecified: Secondary | ICD-10-CM

## 2024-12-10 DIAGNOSIS — I10 Essential (primary) hypertension: Secondary | ICD-10-CM

## 2024-12-10 DIAGNOSIS — I5020 Unspecified systolic (congestive) heart failure: Secondary | ICD-10-CM

## 2025-01-02 ENCOUNTER — Encounter: Admission: RE | Admit: 2025-01-02 | Source: Ambulatory Visit

## 2025-01-03 ENCOUNTER — Encounter
